# Patient Record
Sex: Female | Born: 1980
Health system: Southern US, Community
[De-identification: ages and names within clinical notes are randomized; demographics above are authoritative.]

## PROBLEM LIST (undated history)

## (undated) DIAGNOSIS — J309 Allergic rhinitis, unspecified: Secondary | ICD-10-CM

## (undated) DIAGNOSIS — R519 Headache, unspecified: Secondary | ICD-10-CM

## (undated) DIAGNOSIS — M792 Neuralgia and neuritis, unspecified: Secondary | ICD-10-CM

## (undated) DIAGNOSIS — R251 Tremor, unspecified: Secondary | ICD-10-CM

## (undated) DIAGNOSIS — M5126 Other intervertebral disc displacement, lumbar region: Secondary | ICD-10-CM

## (undated) DIAGNOSIS — R51 Headache: Secondary | ICD-10-CM

## (undated) DIAGNOSIS — G4089 Other seizures: Secondary | ICD-10-CM

## (undated) DIAGNOSIS — I499 Cardiac arrhythmia, unspecified: Secondary | ICD-10-CM

## (undated) DIAGNOSIS — G8929 Other chronic pain: Secondary | ICD-10-CM

## (undated) HISTORY — DX: Cardiac arrhythmia, unspecified: I49.9

## (undated) HISTORY — DX: Tremor, unspecified: R25.1

## (undated) HISTORY — DX: Other seizures: G40.89

## (undated) HISTORY — DX: Other chronic pain: G89.29

## (undated) HISTORY — DX: Headache: R51

## (undated) HISTORY — DX: Neuralgia and neuritis, unspecified: M79.2

## (undated) HISTORY — DX: Allergic rhinitis, unspecified: J30.9

## (undated) HISTORY — DX: Other intervertebral disc displacement, lumbar region: M51.26

## (undated) HISTORY — DX: Headache, unspecified: R51.9

---

## 2009-07-05 ENCOUNTER — Ambulatory Visit (HOSPITAL_COMMUNITY): Admission: RE | Admit: 2009-07-05 | Discharge: 2009-07-05 | Payer: Self-pay | Admitting: Obstetrics & Gynecology

## 2009-07-19 ENCOUNTER — Ambulatory Visit: Payer: Self-pay | Admitting: Obstetrics and Gynecology

## 2009-07-20 ENCOUNTER — Encounter: Payer: Self-pay | Admitting: Family Medicine

## 2009-07-20 ENCOUNTER — Inpatient Hospital Stay (HOSPITAL_COMMUNITY): Admission: RE | Admit: 2009-07-20 | Discharge: 2009-07-23 | Payer: Self-pay | Admitting: Obstetrics and Gynecology

## 2009-07-20 ENCOUNTER — Ambulatory Visit: Payer: Self-pay | Admitting: Family Medicine

## 2010-07-10 LAB — CBC
MCHC: 34.3 g/dL (ref 30.0–36.0)
MCHC: 34.7 g/dL (ref 30.0–36.0)
Platelets: 174 10*3/uL (ref 150–400)
RBC: 3.3 MIL/uL — ABNORMAL LOW (ref 3.87–5.11)
RDW: 12.6 % (ref 11.5–15.5)
WBC: 10.1 10*3/uL (ref 4.0–10.5)

## 2012-09-03 ENCOUNTER — Other Ambulatory Visit (HOSPITAL_COMMUNITY)
Admission: RE | Admit: 2012-09-03 | Discharge: 2012-09-03 | Disposition: A | Discharge status: Home / Self Care | Payer: BC Managed Care – PPO | Source: Ambulatory Visit

## 2012-09-03 DIAGNOSIS — Z124 Encounter for screening for malignant neoplasm of cervix: Secondary | ICD-10-CM | POA: Insufficient documentation

## 2013-04-21 HISTORY — PX: KNEE SURGERY: SHX244

## 2013-10-20 ENCOUNTER — Other Ambulatory Visit: Payer: Self-pay | Admitting: *Deleted

## 2013-10-20 ENCOUNTER — Encounter: Payer: Self-pay | Admitting: *Deleted

## 2013-10-20 ENCOUNTER — Encounter (INDEPENDENT_AMBULATORY_CARE_PROVIDER_SITE_OTHER): Payer: BC Managed Care – PPO

## 2013-10-20 DIAGNOSIS — R002 Palpitations: Secondary | ICD-10-CM

## 2013-10-20 DIAGNOSIS — R0789 Other chest pain: Secondary | ICD-10-CM

## 2013-10-20 NOTE — Progress Notes (Signed)
Patient ID: Cathy Morrow, female   DOB: 13-Apr-1981, 33 y.o.   MRN: [id number] E-Cardio 48 hour holter monitor applied to patient.

## 2014-03-29 ENCOUNTER — Encounter (HOSPITAL_COMMUNITY): Payer: Self-pay | Admitting: Emergency Medicine

## 2014-03-29 ENCOUNTER — Emergency Department (HOSPITAL_COMMUNITY)
Admission: EM | Admit: 2014-03-29 | Discharge: 2014-03-29 | Disposition: A | Discharge status: Home / Self Care | Payer: BC Managed Care – PPO | Attending: Emergency Medicine | Admitting: Emergency Medicine

## 2014-03-29 DIAGNOSIS — Z880 Allergy status to penicillin: Secondary | ICD-10-CM | POA: Insufficient documentation

## 2014-03-29 DIAGNOSIS — Y998 Other external cause status: Secondary | ICD-10-CM | POA: Insufficient documentation

## 2014-03-29 DIAGNOSIS — S24109A Unspecified injury at unspecified level of thoracic spinal cord, initial encounter: Secondary | ICD-10-CM | POA: Insufficient documentation

## 2014-03-29 DIAGNOSIS — Y92410 Unspecified street and highway as the place of occurrence of the external cause: Secondary | ICD-10-CM | POA: Diagnosis not present

## 2014-03-29 DIAGNOSIS — S199XXA Unspecified injury of neck, initial encounter: Secondary | ICD-10-CM | POA: Diagnosis not present

## 2014-03-29 DIAGNOSIS — Y9389 Activity, other specified: Secondary | ICD-10-CM | POA: Diagnosis not present

## 2014-03-29 DIAGNOSIS — S3992XA Unspecified injury of lower back, initial encounter: Secondary | ICD-10-CM | POA: Insufficient documentation

## 2014-03-29 DIAGNOSIS — M545 Low back pain: Secondary | ICD-10-CM

## 2014-03-29 MED ORDER — IBUPROFEN 800 MG PO TABS: 800.0000 mg | ORAL_TABLET | Freq: Three times a day (TID) | ORAL | Status: DC

## 2014-03-29 MED ORDER — METHOCARBAMOL 500 MG PO TABS: 500.0000 mg | ORAL_TABLET | Freq: Two times a day (BID) | ORAL | Status: DC

## 2014-03-29 NOTE — ED Notes (Signed)
Pt in need of ride home. Pt does not know of anyone to come get her from ED.
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
## 2014-03-29 NOTE — Discharge Instructions (Signed)

## 2014-04-05 ENCOUNTER — Other Ambulatory Visit: Payer: Self-pay | Admitting: Family Medicine

## 2014-04-05 ENCOUNTER — Ambulatory Visit
Admission: RE | Admit: 2014-04-05 | Discharge: 2014-04-05 | Disposition: A | Discharge status: Home / Self Care | Payer: BC Managed Care – PPO | Source: Ambulatory Visit | Attending: Family Medicine | Admitting: Family Medicine

## 2014-04-05 DIAGNOSIS — M25552 Pain in left hip: Secondary | ICD-10-CM

## 2014-04-05 DIAGNOSIS — M25532 Pain in left wrist: Secondary | ICD-10-CM

## 2014-04-05 DIAGNOSIS — M5489 Other dorsalgia: Secondary | ICD-10-CM

## 2014-04-26 ENCOUNTER — Ambulatory Visit: Payer: BLUE CROSS/BLUE SHIELD | Attending: Family Medicine | Admitting: Physical Therapy

## 2014-04-26 DIAGNOSIS — S3992XD Unspecified injury of lower back, subsequent encounter: Secondary | ICD-10-CM | POA: Diagnosis present

## 2014-04-26 DIAGNOSIS — M542 Cervicalgia: Secondary | ICD-10-CM | POA: Diagnosis not present

## 2014-04-26 DIAGNOSIS — M79602 Pain in left arm: Secondary | ICD-10-CM | POA: Diagnosis not present

## 2014-04-26 DIAGNOSIS — M545 Low back pain: Secondary | ICD-10-CM | POA: Insufficient documentation

## 2014-04-26 DIAGNOSIS — M25532 Pain in left wrist: Secondary | ICD-10-CM | POA: Insufficient documentation

## 2014-05-02 ENCOUNTER — Ambulatory Visit: Payer: BLUE CROSS/BLUE SHIELD | Admitting: Physical Therapy

## 2014-05-02 DIAGNOSIS — S3992XD Unspecified injury of lower back, subsequent encounter: Secondary | ICD-10-CM | POA: Diagnosis not present

## 2014-05-04 ENCOUNTER — Ambulatory Visit: Payer: BLUE CROSS/BLUE SHIELD | Admitting: Physical Therapy

## 2014-05-04 DIAGNOSIS — S3992XD Unspecified injury of lower back, subsequent encounter: Secondary | ICD-10-CM | POA: Diagnosis not present

## 2014-05-08 ENCOUNTER — Ambulatory Visit: Payer: BLUE CROSS/BLUE SHIELD | Admitting: Physical Therapy

## 2014-05-08 DIAGNOSIS — S3992XD Unspecified injury of lower back, subsequent encounter: Secondary | ICD-10-CM | POA: Diagnosis not present

## 2014-05-08 DIAGNOSIS — M545 Low back pain, unspecified: Secondary | ICD-10-CM

## 2014-05-08 DIAGNOSIS — M549 Dorsalgia, unspecified: Secondary | ICD-10-CM

## 2014-05-08 DIAGNOSIS — M25532 Pain in left wrist: Secondary | ICD-10-CM

## 2014-05-08 NOTE — Therapy (Signed)
[facility] [address] [place], [place], [address] Phone: [phone]   Fax:  [phone]  Physical Therapy Treatment  Patient Details  Name: Cathy Morrow MRN: [id number] Date of Birth: 07-01-1980 Referring Provider:  Farris Has, MD  Encounter Date: 05/08/2014      PT End of Session - 05/08/14 1705    PT Start Time 1549   PT Stop Time 1645   PT Time Calculation (min) 56 min   Activity Tolerance Patient tolerated treatment well;Patient limited by pain      No past medical history on file.  No past surgical history on file.  There were no vitals taken for this visit.  Visit Diagnosis:  Wrist pain, left  Pain of mid back  Left-sided low back pain without sciatica      Subjective Assessment - 05/08/14 1553    Symptoms 6/10 to 10/10 wrist.  Has not seen chiropractor since she started PT.  Back pain 9/10 hip 9/10   Pain Score 9    Pain Location Back   Pain Orientation Left;Lower;Upper   Pain Descriptors / Indicators Spasm;Constant  pain   Aggravating Factors  standing, moving    Pain Relieving Factors Pillows, manual   Effect of Pain on Daily Activities With working pain increases.   Multiple Pain Sites Yes   Pain Score 6   Pain Location Wrist   Pain Descriptors / Indicators Tiring                    [facility] Adult PT Treatment/Exercise - 05/08/14 1600    Exercises   Exercises --  active range to Lt wrist fingers.   Moist Heat Therapy   Number Minutes Moist Heat 30 Minutes   Moist Heat Location --  back   Electrical Stimulation   Electrical Stimulation Location Mid and lower back   Electrical Stimulation Action IFC   Electrical Stimulation Goals Pain   Manual Therapy   Manual Therapy Edema management;Passive ROM  taping for creating space at wrist and edema control.wrist f   Passive ROM wrist, forearm                PT Education - 05/08/14 1654    Education provided Yes   Education Details retrograde, Lt arm starting with axilla and working down arm to hand.   Methods Explanation;Demonstration   Comprehension Verbalized understanding          PT Short Term Goals - 05/08/14 1709    PT SHORT TERM GOAL #5   Time 4   Period Weeks   Status On-going           PT Long Term Goals - 05/08/14 1709    PT LONG TERM GOAL #1   Title Demonstrate and or verbalize techniques to reduce the risk of re-injury to include info on:  posture, body mechnics.   Time 8   Period Weeks   Status Achieved   PT LONG TERM GOAL #2   Title Be independent with advanced HEP for lumbar strengthening.   Time 8   Period Weeks   Status On-going   PT LONG TERM GOAL #3   Title Dress herself with pain decreased >/= 75%   Time 8   Period Weeks   Status On-going   PT LONG TERM GOAL #4   Title Sleep with pain decreased >/= 75%   Time 8   Period Weeks   Status On-going   PT LONG TERM GOAL #  5   Title Walk at work with pain decreased >/= 75%.   Time 8   Period Weeks   Status On-going   Additional Long Term Goals   Additional Long Term Goals Yes   PT LONG TERM GOAL #6   Title Grip items with Lt hand with >/= 75% greater ease.   Time 8   Period Weeks   Status On-going   PT LONG TERM GOAL #7   Title Peform daily activities with her 32 year old son with >/= 75% greater ease.   Time 8   Period Weeks   Status On-going               Plan - 05/08/14 1656    Clinical Impression Statement manual helpful for Lt wrist, modalities to back during wrist treatment.  Patient has decided to not see chiropractor while seeing PT for her back.  Forearm 1/2 cm larger Lt>Rt.  2 " from crease.   PT Next Visit Plan assess tape begin wrist exercises, wean from wrist brace.  Back manual and stretches, progress home exercises.   Consulted and Agree with Plan of Care Patient        Problem List There are no active problems to display for this patient. Cathy BeachKaren Odalis Jordan, PTA 05/08/2014 5:29  PM Phone: [phone] Fax: [phone]   Cathy Morrow,Cathy Morrow 05/08/2014, 5:29 PM  [facility] [address] [place], [place], [address] Phone: [phone]   Fax:  [phone]

## 2014-05-10 ENCOUNTER — Telehealth: Payer: Self-pay | Admitting: *Deleted

## 2014-05-10 ENCOUNTER — Ambulatory Visit: Payer: BLUE CROSS/BLUE SHIELD | Admitting: Physical Therapy

## 2014-05-10 DIAGNOSIS — M25532 Pain in left wrist: Secondary | ICD-10-CM

## 2014-05-10 DIAGNOSIS — M549 Dorsalgia, unspecified: Secondary | ICD-10-CM

## 2014-05-10 DIAGNOSIS — S3992XD Unspecified injury of lower back, subsequent encounter: Secondary | ICD-10-CM | POA: Diagnosis not present

## 2014-05-10 DIAGNOSIS — M545 Low back pain, unspecified: Secondary | ICD-10-CM

## 2014-05-10 NOTE — Patient Instructions (Addendum)
Ask front desk for form to sign for record release to lawyer. Remove tape if it irritates.

## 2014-05-10 NOTE — Therapy (Signed)
[facility] [address] [place], [place], [address] Phone: [phone]   Fax:  [phone]  Physical Therapy Treatment  Patient Details  Name: Cathy Morrow MRN: [id number] Date of Birth: 01/21/1981 Referring Provider:  Farris HasMorrow, Aaron, MD  Encounter Date: 05/10/2014      PT End of Session - 05/10/14 1648    Visit Number 5   Number of Visits 24   PT Start Time 1550   PT Stop Time 1634   PT Time Calculation (min) 44 min   Activity Tolerance Patient limited by pain      No past medical history on file.  No past surgical history on file.  There were no vitals taken for this visit.  Visit Diagnosis:  Wrist pain, left  Pain of mid back  Left-sided low back pain without sciatica      Subjective Assessment - 05/10/14 1608    Symptoms tape helped4/ 10 vs 8/10.  now4/10 able to open close hand with no pain.  Doing edema retrograde several times a day.  Back 6/10  better with extension, back better with extension stretches.   Pain Score 6    Pain Location Back   Pain Orientation Left;Lower;Upper   Pain Descriptors / Indicators Spasm   Aggravating Factors  standing moving   Pain Relieving Factors pillow, manual   Effect of Pain on Daily Activities pain with activity, frequent rests   Pain Score 4   Pain Location Wrist   Pain Orientation Left   Pain Radiating Towards hand.   Pain Descriptors / Indicators Tiring  feels different, strange like I've given blood   Pain Frequency Intermittent                    [facility] Adult PT Treatment/Exercise - 05/10/14 1550    Exercises   Exercises --  hand and wrist exercises added to home from drawer.   Lumbar Exercises: Stretches   Passive Hamstring Stretch 3 reps;30 seconds  increased back spasm, using sheet.   Single Knee to Chest Stretch 5 reps;10 seconds   Pelvic Tilt --  10 reps 5 seconds after instruction able to do correctly.   Prone on Elbows Stretch 60  seconds  felt good but reported feeling dizzy with stretches.   Manual Therapy   Manual Therapy --  taping Lt arm replaced.  Added tape along paraspinals     PT Lucy AntiguaSteve Chasse informed of patient's dizziness.           PT Education - 05/10/14 1647    Education provided Yes   Education Details hand, wrist exercise.   Person(s) Educated Patient   Methods Explanation;Demonstration;Handout   Comprehension Verbalized understanding          PT Short Term Goals - 05/08/14 1709    PT SHORT TERM GOAL #5   Time 4   Period Weeks   Status On-going           PT Long Term Goals - 05/08/14 1709    PT LONG TERM GOAL #1   Title Demonstrate and or verbalize techniques to reduce the risk of re-injury to include info on:  posture, body mechnics.   Time 8   Period Weeks   Status Achieved   PT LONG TERM GOAL #2   Title Be independent with advanced HEP for lumbar strengthening.   Time 8   Period Weeks   Status On-going   PT LONG TERM GOAL #3   Title  Dress herself with pain decreased >/= 75%   Time 8   Period Weeks   Status On-going   PT LONG TERM GOAL #4   Title Sleep with pain decreased >/= 75%   Time 8   Period Weeks   Status On-going   PT LONG TERM GOAL #5   Title Walk at work with pain decreased >/= 75%.   Time 8   Period Weeks   Status On-going   Additional Long Term Goals   Additional Long Term Goals Yes   PT LONG TERM GOAL #6   Title Grip items with Lt hand with >/= 75% greater ease.   Time 8   Period Weeks   Status On-going   PT LONG TERM GOAL #7   Title Peform daily activities with her 80 year old son with >/= 75% greater ease.   Time 8   Period Weeks   Status On-going               Plan - 05/10/14 1649    Clinical Impression Statement tape helpful. unable to add back exercises to home due to pain or dizziness.  able to add wrist exercises for home.   pain wrist hand fist now pain free, intermittant.   PT Next Visit Plan assesstape, review  wrist/hand exercises.  continue back exercises as tolerated.        Problem List There are no active problems to display for this patient. Liz Beach, PTA1/20/2016, 5:05 PM  [facility] [address] [place], [place], [address] Phone: [phone]   Fax:  [phone]

## 2014-05-10 NOTE — Telephone Encounter (Signed)
Pt requested for more appts. Gave her four more...td

## 2014-05-11 ENCOUNTER — Telehealth: Payer: Self-pay | Admitting: *Deleted
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
## 2014-05-12 ENCOUNTER — Ambulatory Visit: Payer: BLUE CROSS/BLUE SHIELD | Admitting: Physical Therapy

## 2014-05-17 ENCOUNTER — Ambulatory Visit: Payer: BLUE CROSS/BLUE SHIELD | Admitting: Physical Therapy

## 2014-05-17 DIAGNOSIS — M545 Low back pain, unspecified: Secondary | ICD-10-CM

## 2014-05-17 DIAGNOSIS — M549 Dorsalgia, unspecified: Secondary | ICD-10-CM

## 2014-05-17 DIAGNOSIS — M25532 Pain in left wrist: Secondary | ICD-10-CM

## 2014-05-17 DIAGNOSIS — S3992XD Unspecified injury of lower back, subsequent encounter: Secondary | ICD-10-CM | POA: Diagnosis not present

## 2014-05-17 NOTE — Patient Instructions (Signed)
Elbow Prop (Extension)   Prop body up on elbows for __30__ seconds. Slowly lower it. Repeat 3____ times. Do _1-2___ sessions per day.  http://gt2.exer.us/243   Copyright  VHI. All rights reserved.  Back Hyperextension: Using Arms       .  Copyright  VHI. All rights reserved.   Copyright  VHI. All rights reserved.   Copyright  VHI. All rights reserved.  Backward Bend (Standing)   Arch backward to make hollow of back deeper. Hold _10- 20___ seconds. Repeat ___3-5_ times per set. Do _1-2___ sets per session. Do __as needed__ sessions per day.  http://orth.exer.us/178   Copyright  VHI. All rights reserved.   Bracing With Arm Lift (Prone)   With pillow support, lie on abdomen. Find neutral spine. Tighten pelvic floor and abdominals and hold . Alternately raise arms off floor. Repeat 5-10___ times. Do _1-2__ times a day.   Copyright  VHI. All rights reserved.  Bracing With Leg Lift (Prone)   With pillow support, lie on abdomen and neutral spine, tighten pelvic floor and abdominals and hold. Alternately raise legs off floor. Repeat _5-10__ times. Do 1-2___ times a day.   Copyright  VHI. All rights reserved.   Bracing With Arm / Leg Lift (Prone)   With pillow support, lie on abdomen. Find neutral spine. Tighten pelvic floor and abdominals and hold. Alternately raise one arm and opposite leg. Repeat 5 - 10___ times. Do ___ times a day.   Copyright  VHI. All rights reserved.

## 2014-05-17 NOTE — Therapy (Signed)
[facility] [address] [place], [place], [address] Phone: [phone]   Fax:  [phone]  Physical Therapy Treatment  Patient Details  Name: Cathy Morrow MRN: [id number] Date of Birth: 10/19/1980 Referring Provider:  Morrow, Aaron, MD  Encounter Date: 05/17/2014      PT End of Session - 05/17/14 1223    Visit Number 6   Number of Visits 24   PT Start Time 0803   PT Stop Time 0905   PT Time Calculation (min) 62 min   Activity Tolerance Patient tolerated treatment well;Patient limited by pain      No past medical history on file.  No past surgical history on file.  There were no vitals taken for this visit.  Visit Diagnosis:  Wrist pain, left  Pain of mid back  Left-sided low back pain without sciatica      Subjective Assessment - 05/17/14 0805    Symptoms back 5/10 less spasm with tape, wrist 8/10 can't find a way to get comfortable to sleep. Cannot make a fist today .  hand goes numb at night.  Feels like it is not circulating. Can't wear rings due to fingers swelling intermittantly.   Pain Score 5    Pain Location Back   Pain Orientation Left  whole back rt and lt.   Pain Descriptors / Indicators Spasm;Aching;Dull   Aggravating Factors  standing on feet, sitting unsupported,   Pain Relieving Factors nothing really, warm showers, laying down   Effect of Pain on Daily Activities stretches, sit down    Multiple Pain Sites Yes   Pain Score 8   Pain Location Wrist   Pain Orientation Left   Pain Radiating Towards fingers   Pain Descriptors / Indicators Numbness  swollen   Pain Frequency Constant                    [facility] Adult PT Treatment/Exercise - 05/17/14 0810    Lumbar Exercises: Stretches   Prone on Elbows Stretch 3 reps;30 seconds   Lumbar Exercises: Prone   Single Arm Raise 5 reps   Straight Leg Raise 5 reps   Opposite Arm/Leg Raise Left arm/Right leg;Right arm/Left leg;5 reps   cues, rests brief needed.   Moist Heat Therapy   Number Minutes Moist Heat 15 Minutes   Moist Heat Location --  back   Cryotherapy   Number Minutes Cryotherapy 15 Minutes   Cryotherapy Location Wrist  Lt   Type of Cryotherapy Other (comment)  Gel pack   Manual Therapy   Manual Therapy --  taping replaced wrist and back as previous                PT Education - 05/17/14 1229    Education provided Yes   Education Details prone back strengthening, prone on elbows, standing back extension   Person(s) Educated Patient   Methods Explanation;Demonstration;Handout   Comprehension Verbalized understanding;Returned demonstration          PT Short Term Goals - 05/17/14 0811    PT SHORT TERM GOAL #1   Title Be independent with initial home exercises for flexibility exercises.   Time 4   Status Achieved   PT SHORT TERM GOAL #2   Title dress herself with pain decreased >/= 25%   Time 4   Period Weeks   Status On-going   PT SHORT TERM GOAL #3   Title Sleep with pain decreased>/= 25%   Time 4     Period Weeks   Status On-going   PT SHORT TERM GOAL #4   Title walk at work with pain decreased >/= 25%   Time 4   Period Weeks   Status On-going           PT Long Term Goals - 05/17/14 0813    PT LONG TERM GOAL #3   Title Dress herself with pain decreased >/= 75%   Time 8   Period Weeks   Status On-going   PT LONG TERM GOAL #4   Title Sleep with pain decreased >/= 75%   Time 8   Period Weeks   Status Not Met   PT LONG TERM GOAL #5   Title Walk at work with pain decreased >/= 75%.   Time 8   Status On-going               Plan - 05/17/14 1230    Clinical Impression Statement No dizziness today, Pain has returned to wrist. Able to progress back exercise program.  Fewer back spasms noted with tape.  Patient very focused on her wrist pain.  Pain with walking 10% improved. Dressing and sleeping unchanged.   PT Next Visit Plan review home exercise back  stabilization 1.(from drawer) Review hand exercises (From drawer) Any PT techniques?        Problem List There are no active problems to display for this patient.  Karen Harris, PTA 05/17/2014 12:42 PM Phone: [phone] Fax: [phone]  HARRIS,KAREN 05/17/2014, 12:42 PM  [facility] [address] [place], [place], [address] Phone: [phone]   Fax:  [phone]      

## 2014-05-22 ENCOUNTER — Ambulatory Visit: Payer: BLUE CROSS/BLUE SHIELD | Attending: Family Medicine | Admitting: Physical Therapy

## 2014-05-22 ENCOUNTER — Encounter: Payer: Self-pay | Admitting: Physical Therapy

## 2014-05-22 DIAGNOSIS — M542 Cervicalgia: Secondary | ICD-10-CM | POA: Insufficient documentation

## 2014-05-22 DIAGNOSIS — M25532 Pain in left wrist: Secondary | ICD-10-CM | POA: Insufficient documentation

## 2014-05-22 DIAGNOSIS — M545 Low back pain: Secondary | ICD-10-CM | POA: Insufficient documentation

## 2014-05-22 DIAGNOSIS — S3992XD Unspecified injury of lower back, subsequent encounter: Secondary | ICD-10-CM | POA: Insufficient documentation

## 2014-05-22 DIAGNOSIS — M79602 Pain in left arm: Secondary | ICD-10-CM | POA: Insufficient documentation

## 2014-05-24 ENCOUNTER — Ambulatory Visit: Payer: BLUE CROSS/BLUE SHIELD | Admitting: Physical Therapy

## 2014-05-24 ENCOUNTER — Encounter: Payer: BLUE CROSS/BLUE SHIELD | Admitting: Physical Therapy
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
## 2015-02-22 ENCOUNTER — Encounter: Payer: Self-pay | Admitting: Neurology

## 2015-02-22 ENCOUNTER — Ambulatory Visit (INDEPENDENT_AMBULATORY_CARE_PROVIDER_SITE_OTHER): Payer: BLUE CROSS/BLUE SHIELD | Admitting: Neurology

## 2015-02-22 VITALS — BP 138/99 | HR 87 | Ht 66.0 in | Wt 212.8 lb

## 2015-02-22 DIAGNOSIS — R413 Other amnesia: Secondary | ICD-10-CM

## 2015-02-22 DIAGNOSIS — R51 Headache: Secondary | ICD-10-CM

## 2015-02-22 DIAGNOSIS — S0990XA Unspecified injury of head, initial encounter: Secondary | ICD-10-CM | POA: Diagnosis not present

## 2015-02-22 DIAGNOSIS — R4020 Unspecified coma: Secondary | ICD-10-CM

## 2015-02-22 DIAGNOSIS — R404 Transient alteration of awareness: Secondary | ICD-10-CM | POA: Diagnosis not present

## 2015-02-22 DIAGNOSIS — F0781 Postconcussional syndrome: Secondary | ICD-10-CM | POA: Diagnosis not present

## 2015-02-22 DIAGNOSIS — H538 Other visual disturbances: Secondary | ICD-10-CM

## 2015-02-22 DIAGNOSIS — R519 Headache, unspecified: Secondary | ICD-10-CM

## 2015-02-22 MED ORDER — NORTRIPTYLINE HCL 10 MG PO CAPS: 10.0000 mg | ORAL_CAPSULE | Freq: Every day | ORAL | Status: DC
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
## 2015-02-22 NOTE — Patient Instructions (Addendum)
Overall you are doing fairly well but I do want to suggest a few things today:   Remember to drink plenty of fluid, eat healthy meals and do not skip any meals. Try to eat protein with a every meal and eat a healthy snack such as fruit or nuts in between meals. Try to keep a regular sleep-wake schedule and try to exercise daily, particularly in the form of walking, 20-30 minutes a day, if you can.   As far as your medications are concerned, I would like to suggest: Nortriptyline 10mg  an hour before bedtime.  As far as diagnostic testing: Neuropsychological testing, MRi of the brain, Labs  Please call us with any interim questions, concerns, problems, updates or refill requests.   Please also call us for any test results so we can go over those with you on the phone.  My clinical assistant and will answer any of your questions and relay your messages to me and also relay most of my messages to you.   Our phone number is [phone]. We also have an after hours call service for urgent matters and there is a physician on-call for urgent questions. For any emergencies you know to call 911 or go to the nearest emergency room

## 2015-02-25 ENCOUNTER — Encounter: Payer: Self-pay | Admitting: Neurology

## 2015-02-25 DIAGNOSIS — F0781 Postconcussional syndrome: Secondary | ICD-10-CM | POA: Insufficient documentation

## 2015-02-25 LAB — METHYLMALONIC ACID, SERUM: Methylmalonic Acid: 195 nmol/L (ref 0–378)

## 2015-02-25 LAB — B12 AND FOLATE PANEL
FOLATE: 13.3 ng/mL (ref >3.0)
VITAMIN B 12: 355 pg/mL (ref 211–946)

## 2015-02-26 ENCOUNTER — Telehealth: Payer: Self-pay | Admitting: *Deleted

## 2015-02-26 NOTE — Telephone Encounter (Signed)
-----   Message from Anson FretAntonia B Ahern, MD sent at 02/25/2015  7:07 PM EST ----- Let patient know labs are normal thanks

## 2015-02-26 NOTE — Telephone Encounter (Signed)
LVM about normal labs. Okay per DPR. Gave [facility] phone number and hours if she has further questions.

## 2015-03-19 ENCOUNTER — Encounter: Payer: Self-pay | Admitting: Neurology

## 2015-03-21 ENCOUNTER — Encounter (INDEPENDENT_AMBULATORY_CARE_PROVIDER_SITE_OTHER): Payer: Self-pay

## 2015-03-21 ENCOUNTER — Ambulatory Visit (INDEPENDENT_AMBULATORY_CARE_PROVIDER_SITE_OTHER): Payer: BLUE CROSS/BLUE SHIELD

## 2015-03-21 DIAGNOSIS — R413 Other amnesia: Secondary | ICD-10-CM | POA: Diagnosis not present

## 2015-03-21 DIAGNOSIS — H538 Other visual disturbances: Secondary | ICD-10-CM

## 2015-03-21 DIAGNOSIS — R51 Headache: Secondary | ICD-10-CM

## 2015-03-21 DIAGNOSIS — S0990XA Unspecified injury of head, initial encounter: Secondary | ICD-10-CM

## 2015-03-21 DIAGNOSIS — R519 Headache, unspecified: Secondary | ICD-10-CM

## 2015-03-21 DIAGNOSIS — R4020 Unspecified coma: Secondary | ICD-10-CM

## 2015-03-21 DIAGNOSIS — R404 Transient alteration of awareness: Secondary | ICD-10-CM

## 2015-03-22 MED ORDER — GADOPENTETATE DIMEGLUMINE 469.01 MG/ML IV SOLN: 20.0000 mL | Freq: Once | INTRAVENOUS | Status: AC | PRN

## 2015-03-23 ENCOUNTER — Telehealth: Payer: Self-pay | Admitting: *Deleted

## 2015-03-23 NOTE — Telephone Encounter (Signed)
-----   Message from Anson FretAntonia B Ahern, MD sent at 03/23/2015  7:51 AM EST ----- MRi of the brain is normal thanks

## 2015-03-23 NOTE — Telephone Encounter (Signed)
LVM about normal MRI brain per Dr Lucia GaskinsAhern. Gave [facility] phone number and hours if she has further questions.

## 2015-05-28 ENCOUNTER — Ambulatory Visit (INDEPENDENT_AMBULATORY_CARE_PROVIDER_SITE_OTHER): Payer: BLUE CROSS/BLUE SHIELD | Admitting: Neurology

## 2015-05-28 ENCOUNTER — Encounter: Payer: Self-pay | Admitting: Neurology

## 2015-05-28 VITALS — BP 150/95 | HR 74 | Ht 66.0 in | Wt 212.0 lb

## 2015-05-28 DIAGNOSIS — F329 Major depressive disorder, single episode, unspecified: Secondary | ICD-10-CM

## 2015-05-28 DIAGNOSIS — F32A Depression, unspecified: Secondary | ICD-10-CM

## 2015-05-28 MED ORDER — CITALOPRAM HYDROBROMIDE 10 MG PO TABS: 10.0000 mg | ORAL_TABLET | Freq: Every day | ORAL | Status: DC

## 2015-05-28 NOTE — Patient Instructions (Addendum)
Remember to drink plenty of fluid, eat healthy meals and do not skip any meals. Try to eat protein with a every meal and eat a healthy snack such as fruit or nuts in between meals. Try to keep a regular sleep-wake schedule and try to exercise daily, particularly in the form of walking, 20-30 minutes a day, if you can.   As far as your medications are concerned, I would like to suggest: Celexa  in the morning. Stop nortriptyline   As far as diagnostic testing: Memory testing  I would like to see you back after memory testing, sooner if we need to. Please call us with any interim questions, concerns, problems, updates or refill requests.   Our phone number is [phone]. We also have an after hours call service for urgent matters and there is a physician on-call for urgent questions. For any emergencies you know to call 911 or go to the nearest emergency room
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
## 2015-07-02 ENCOUNTER — Telehealth: Payer: Self-pay | Admitting: Neurology

## 2015-07-02 DIAGNOSIS — R413 Other amnesia: Secondary | ICD-10-CM

## 2015-07-02 NOTE — Telephone Encounter (Signed)
Patient called regarding referral to Psychiatrist, has never heard back from anyone also referral for memory testing, was referred in November, didn't hear from anyone.

## 2015-07-02 NOTE — Telephone Encounter (Signed)
Looking back and Dr. Trevor MaceAhern's note patient needed to be seen  by Dr. Leonides CaveZelson. I have spoke to patient and she will waiting  on Dr. Leonides CaveZelson to call her. Cathy Morrow  Please place a need order for Dr. Leonides CaveZelson Neuro rehab . Thanks Annabelle Harmanana .

## 2015-07-03 NOTE — Telephone Encounter (Signed)
Order has been sent to Dr. Leonides CaveZelson.

## 2015-07-24 DIAGNOSIS — M5416 Radiculopathy, lumbar region: Secondary | ICD-10-CM | POA: Diagnosis not present

## 2015-07-25 DIAGNOSIS — M5442 Lumbago with sciatica, left side: Secondary | ICD-10-CM | POA: Diagnosis not present

## 2015-07-25 DIAGNOSIS — G8929 Other chronic pain: Secondary | ICD-10-CM | POA: Diagnosis not present

## 2015-07-27 DIAGNOSIS — M5442 Lumbago with sciatica, left side: Secondary | ICD-10-CM | POA: Diagnosis not present

## 2015-07-27 DIAGNOSIS — G8929 Other chronic pain: Secondary | ICD-10-CM | POA: Diagnosis not present

## 2015-08-02 DIAGNOSIS — M5442 Lumbago with sciatica, left side: Secondary | ICD-10-CM | POA: Diagnosis not present

## 2015-08-02 DIAGNOSIS — G8929 Other chronic pain: Secondary | ICD-10-CM | POA: Diagnosis not present

## 2015-08-08 DIAGNOSIS — G8929 Other chronic pain: Secondary | ICD-10-CM | POA: Diagnosis not present

## 2015-08-08 DIAGNOSIS — M5442 Lumbago with sciatica, left side: Secondary | ICD-10-CM | POA: Diagnosis not present

## 2015-08-10 DIAGNOSIS — M5442 Lumbago with sciatica, left side: Secondary | ICD-10-CM | POA: Diagnosis not present

## 2015-08-10 DIAGNOSIS — G8929 Other chronic pain: Secondary | ICD-10-CM | POA: Diagnosis not present

## 2015-08-14 ENCOUNTER — Telehealth: Payer: Self-pay | Admitting: Neurology

## 2015-08-14 NOTE — Telephone Encounter (Signed)
Thank you Annabelle Harmanana. Noted.

## 2015-08-14 NOTE — Telephone Encounter (Signed)
Fax was sent relaying - Patient failed to make initial contact with office . Patient has not called back to schedule voice mails have been left for patient. If patient want's to schedule she can call office at [phone].  Emma fax placed on your desk. Thanks Annabelle Harmanana.

## 2015-08-15 DIAGNOSIS — M5442 Lumbago with sciatica, left side: Secondary | ICD-10-CM | POA: Diagnosis not present

## 2015-08-15 DIAGNOSIS — G8929 Other chronic pain: Secondary | ICD-10-CM | POA: Diagnosis not present

## 2015-08-17 DIAGNOSIS — G8929 Other chronic pain: Secondary | ICD-10-CM | POA: Diagnosis not present

## 2015-08-17 DIAGNOSIS — M5442 Lumbago with sciatica, left side: Secondary | ICD-10-CM | POA: Diagnosis not present

## 2015-08-21 ENCOUNTER — Ambulatory Visit: Payer: No Typology Code available for payment source | Admitting: Psychology

## 2015-08-21 DIAGNOSIS — M5442 Lumbago with sciatica, left side: Secondary | ICD-10-CM | POA: Diagnosis not present

## 2015-08-21 DIAGNOSIS — G8929 Other chronic pain: Secondary | ICD-10-CM | POA: Diagnosis not present

## 2015-08-23 DIAGNOSIS — M5442 Lumbago with sciatica, left side: Secondary | ICD-10-CM | POA: Diagnosis not present

## 2015-08-23 DIAGNOSIS — G8929 Other chronic pain: Secondary | ICD-10-CM | POA: Diagnosis not present

## 2015-08-30 DIAGNOSIS — G8929 Other chronic pain: Secondary | ICD-10-CM | POA: Diagnosis not present

## 2015-08-30 DIAGNOSIS — M5442 Lumbago with sciatica, left side: Secondary | ICD-10-CM | POA: Diagnosis not present

## 2015-09-04 DIAGNOSIS — G8929 Other chronic pain: Secondary | ICD-10-CM | POA: Diagnosis not present

## 2015-09-04 DIAGNOSIS — M5442 Lumbago with sciatica, left side: Secondary | ICD-10-CM | POA: Diagnosis not present

## 2015-09-06 DIAGNOSIS — M5442 Lumbago with sciatica, left side: Secondary | ICD-10-CM | POA: Diagnosis not present

## 2015-09-06 DIAGNOSIS — G8929 Other chronic pain: Secondary | ICD-10-CM | POA: Diagnosis not present

## 2015-09-11 DIAGNOSIS — G8929 Other chronic pain: Secondary | ICD-10-CM | POA: Diagnosis not present

## 2015-09-11 DIAGNOSIS — M5442 Lumbago with sciatica, left side: Secondary | ICD-10-CM | POA: Diagnosis not present

## 2015-09-13 DIAGNOSIS — G8929 Other chronic pain: Secondary | ICD-10-CM | POA: Diagnosis not present

## 2015-09-13 DIAGNOSIS — M5442 Lumbago with sciatica, left side: Secondary | ICD-10-CM | POA: Diagnosis not present

## 2015-09-18 DIAGNOSIS — M5442 Lumbago with sciatica, left side: Secondary | ICD-10-CM | POA: Diagnosis not present

## 2015-09-18 DIAGNOSIS — G8929 Other chronic pain: Secondary | ICD-10-CM | POA: Diagnosis not present

## 2015-09-21 ENCOUNTER — Ambulatory Visit: Payer: No Typology Code available for payment source | Attending: Psychology | Admitting: Psychology

## 2015-09-21 ENCOUNTER — Encounter: Payer: Self-pay | Admitting: Psychology

## 2015-09-21 DIAGNOSIS — R419 Unspecified symptoms and signs involving cognitive functions and awareness: Secondary | ICD-10-CM | POA: Insufficient documentation

## 2015-09-21 DIAGNOSIS — F4321 Adjustment disorder with depressed mood: Secondary | ICD-10-CM | POA: Insufficient documentation

## 2015-09-21 DIAGNOSIS — F329 Major depressive disorder, single episode, unspecified: Secondary | ICD-10-CM | POA: Diagnosis not present
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
## 2015-09-26 ENCOUNTER — Telehealth: Payer: Self-pay | Admitting: Psychology

## 2015-09-26 DIAGNOSIS — M5126 Other intervertebral disc displacement, lumbar region: Secondary | ICD-10-CM | POA: Diagnosis not present

## 2015-09-26 NOTE — Telephone Encounter (Signed)
Cathy Morrow cancelled her appointment on 09/28/15 set to discuss evaluation results citing financial concerns. I left her a message to call me so we can at least discuss the evaluation findings over the phone.

## 2015-09-28 ENCOUNTER — Ambulatory Visit: Payer: No Typology Code available for payment source | Admitting: Psychology

## 2015-10-09 DIAGNOSIS — G8929 Other chronic pain: Secondary | ICD-10-CM | POA: Diagnosis not present

## 2015-10-09 DIAGNOSIS — M5442 Lumbago with sciatica, left side: Secondary | ICD-10-CM | POA: Diagnosis not present

## 2015-10-11 DIAGNOSIS — G8929 Other chronic pain: Secondary | ICD-10-CM | POA: Diagnosis not present

## 2015-10-11 DIAGNOSIS — M5442 Lumbago with sciatica, left side: Secondary | ICD-10-CM | POA: Diagnosis not present

## 2015-10-16 DIAGNOSIS — M5442 Lumbago with sciatica, left side: Secondary | ICD-10-CM | POA: Diagnosis not present

## 2015-10-16 DIAGNOSIS — G8929 Other chronic pain: Secondary | ICD-10-CM | POA: Diagnosis not present

## 2015-10-18 DIAGNOSIS — M5442 Lumbago with sciatica, left side: Secondary | ICD-10-CM | POA: Diagnosis not present

## 2015-10-18 DIAGNOSIS — G8929 Other chronic pain: Secondary | ICD-10-CM | POA: Diagnosis not present

## 2015-10-25 DIAGNOSIS — G8929 Other chronic pain: Secondary | ICD-10-CM | POA: Diagnosis not present

## 2015-10-25 DIAGNOSIS — M5442 Lumbago with sciatica, left side: Secondary | ICD-10-CM | POA: Diagnosis not present

## 2015-10-30 DIAGNOSIS — G8929 Other chronic pain: Secondary | ICD-10-CM | POA: Diagnosis not present

## 2015-10-30 DIAGNOSIS — M5442 Lumbago with sciatica, left side: Secondary | ICD-10-CM | POA: Diagnosis not present

## 2015-11-01 DIAGNOSIS — M5442 Lumbago with sciatica, left side: Secondary | ICD-10-CM | POA: Diagnosis not present

## 2015-11-01 DIAGNOSIS — G8929 Other chronic pain: Secondary | ICD-10-CM | POA: Diagnosis not present

## 2016-02-11 DIAGNOSIS — M533 Sacrococcygeal disorders, not elsewhere classified: Secondary | ICD-10-CM | POA: Diagnosis not present

## 2016-02-11 DIAGNOSIS — M5126 Other intervertebral disc displacement, lumbar region: Secondary | ICD-10-CM | POA: Diagnosis not present

## 2016-02-20 DIAGNOSIS — M533 Sacrococcygeal disorders, not elsewhere classified: Secondary | ICD-10-CM | POA: Diagnosis not present

## 2016-02-20 DIAGNOSIS — M5442 Lumbago with sciatica, left side: Secondary | ICD-10-CM | POA: Diagnosis not present

## 2016-03-09 ENCOUNTER — Other Ambulatory Visit: Payer: Self-pay | Admitting: Neurology

## 2016-06-02 ENCOUNTER — Other Ambulatory Visit: Payer: Self-pay | Admitting: Neurology

## 2016-06-27 DIAGNOSIS — M47816 Spondylosis without myelopathy or radiculopathy, lumbar region: Secondary | ICD-10-CM | POA: Diagnosis not present

## 2016-06-27 DIAGNOSIS — M5126 Other intervertebral disc displacement, lumbar region: Secondary | ICD-10-CM | POA: Diagnosis not present

## 2016-07-04 ENCOUNTER — Other Ambulatory Visit: Payer: Self-pay | Admitting: Neurology

## 2016-07-14 DIAGNOSIS — J069 Acute upper respiratory infection, unspecified: Secondary | ICD-10-CM | POA: Diagnosis not present

## 2016-08-25 DIAGNOSIS — Z711 Person with feared health complaint in whom no diagnosis is made: Secondary | ICD-10-CM | POA: Diagnosis not present

## 2016-08-25 DIAGNOSIS — Z113 Encounter for screening for infections with a predominantly sexual mode of transmission: Secondary | ICD-10-CM | POA: Diagnosis not present

## 2016-08-25 DIAGNOSIS — N390 Urinary tract infection, site not specified: Secondary | ICD-10-CM | POA: Diagnosis not present

## 2017-02-03 DIAGNOSIS — B850 Pediculosis due to Pediculus humanus capitis: Secondary | ICD-10-CM | POA: Diagnosis not present

## 2017-03-04 DIAGNOSIS — G894 Chronic pain syndrome: Secondary | ICD-10-CM | POA: Diagnosis not present

## 2017-03-04 DIAGNOSIS — M47816 Spondylosis without myelopathy or radiculopathy, lumbar region: Secondary | ICD-10-CM | POA: Diagnosis not present

## 2017-03-10 DIAGNOSIS — R269 Unspecified abnormalities of gait and mobility: Secondary | ICD-10-CM | POA: Diagnosis not present

## 2017-05-25 DIAGNOSIS — M25552 Pain in left hip: Secondary | ICD-10-CM | POA: Diagnosis not present

## 2017-05-25 DIAGNOSIS — M545 Low back pain: Secondary | ICD-10-CM | POA: Diagnosis not present

## 2017-05-25 DIAGNOSIS — G8929 Other chronic pain: Secondary | ICD-10-CM | POA: Diagnosis not present

## 2017-06-01 DIAGNOSIS — M47816 Spondylosis without myelopathy or radiculopathy, lumbar region: Secondary | ICD-10-CM | POA: Diagnosis not present

## 2017-06-08 DIAGNOSIS — M461 Sacroiliitis, not elsewhere classified: Secondary | ICD-10-CM | POA: Diagnosis not present

## 2017-06-08 DIAGNOSIS — G5712 Meralgia paresthetica, left lower limb: Secondary | ICD-10-CM | POA: Diagnosis not present

## 2017-06-08 DIAGNOSIS — M47816 Spondylosis without myelopathy or radiculopathy, lumbar region: Secondary | ICD-10-CM | POA: Diagnosis not present

## 2017-06-10 DIAGNOSIS — Z0271 Encounter for disability determination: Secondary | ICD-10-CM

## 2017-06-16 DIAGNOSIS — G5712 Meralgia paresthetica, left lower limb: Secondary | ICD-10-CM | POA: Diagnosis not present

## 2017-06-22 ENCOUNTER — Ambulatory Visit (HOSPITAL_COMMUNITY)
Admission: EM | Admit: 2017-06-22 | Discharge: 2017-06-22 | Disposition: A | Discharge status: Home / Self Care | Payer: BLUE CROSS/BLUE SHIELD | Source: Home / Self Care

## 2017-06-22 ENCOUNTER — Encounter (HOSPITAL_COMMUNITY): Payer: Self-pay

## 2017-06-22 ENCOUNTER — Other Ambulatory Visit: Payer: Self-pay

## 2017-06-22 ENCOUNTER — Emergency Department (HOSPITAL_COMMUNITY): Payer: BLUE CROSS/BLUE SHIELD

## 2017-06-22 ENCOUNTER — Emergency Department (HOSPITAL_COMMUNITY)
Admission: EM | Admit: 2017-06-22 | Discharge: 2017-06-23 | Disposition: A | Discharge status: Home / Self Care | Payer: BLUE CROSS/BLUE SHIELD | Attending: Emergency Medicine | Admitting: Emergency Medicine

## 2017-06-22 DIAGNOSIS — Z79899 Other long term (current) drug therapy: Secondary | ICD-10-CM | POA: Diagnosis not present

## 2017-06-22 DIAGNOSIS — R0789 Other chest pain: Secondary | ICD-10-CM | POA: Insufficient documentation

## 2017-06-22 DIAGNOSIS — R0602 Shortness of breath: Secondary | ICD-10-CM | POA: Insufficient documentation

## 2017-06-22 DIAGNOSIS — R61 Generalized hyperhidrosis: Secondary | ICD-10-CM | POA: Insufficient documentation

## 2017-06-22 DIAGNOSIS — R079 Chest pain, unspecified: Secondary | ICD-10-CM | POA: Diagnosis not present

## 2017-06-22 LAB — CBC
HCT: 41.7 % (ref 36.0–46.0)
HEMOGLOBIN: 13.6 g/dL (ref 12.0–15.0)
MCH: 29.3 pg (ref 26.0–34.0)
MCHC: 32.6 g/dL (ref 30.0–36.0)
MCV: 89.9 fL (ref 78.0–100.0)
Platelets: 389 10*3/uL (ref 150–400)
RBC: 4.64 MIL/uL (ref 3.87–5.11)
RDW: 12.9 % (ref 11.5–15.5)
WBC: 13.2 10*3/uL — ABNORMAL HIGH (ref 4.0–10.5)

## 2017-06-22 LAB — BASIC METABOLIC PANEL
ANION GAP: 10 (ref 5–15)
BUN: 9 mg/dL (ref 6–20)
CO2: 25 mmol/L (ref 22–32)
CREATININE: 0.69 mg/dL (ref 0.44–1.00)
Calcium: 9.1 mg/dL (ref 8.9–10.3)
Chloride: 100 mmol/L — ABNORMAL LOW (ref 101–111)
GFR calc Af Amer: >60 mL/min (ref >60)
GFR calc non Af Amer: >60 mL/min (ref >60)
GLUCOSE: 83 mg/dL (ref 65–99)
Potassium: 4.1 mmol/L (ref 3.5–5.1)
Sodium: 135 mmol/L (ref 135–145)

## 2017-06-22 MED ORDER — NITROGLYCERIN 0.4 MG SL SUBL
0.4000 mg | SUBLINGUAL_TABLET | SUBLINGUAL | Status: DC | PRN
  Administered 2017-06-22: 0.4 mg via SUBLINGUAL
  Filled 2017-06-22: qty 1

## 2017-06-22 MED ORDER — ASPIRIN 81 MG PO CHEW
324.0000 mg | CHEWABLE_TABLET | Freq: Once | ORAL | Status: AC
  Administered 2017-06-22: 324 mg via ORAL
  Filled 2017-06-22: qty 4

## 2017-06-22 NOTE — ED Triage Notes (Signed)
Pt endorses left sided CP with radiation to the left neck and shoulder, with diaphoresis and shob. Pt has hx of arrhythmia. Hypertensive in triage. Axox4.

## 2017-06-22 NOTE — ED Notes (Signed)
Spoke to patient at triage. Having intermittent chest tightness.  Patient opted to go to ed

## 2017-06-22 NOTE — ED Provider Notes (Signed)
[facility] EMERGENCY DEPARTMENT Provider Note   CSN: [id number] Arrival date & time: 06/22/17  1717     History   Chief Complaint Chief Complaint  Patient presents with  . Chest Pain    HPI Cathy Morrow is a 37 y.o. female who presents the emergency department today for intermittent chest pains over the last several days. This is a new problem. Patient states that 3 days ago she started developing left sided chest pain with radiation into her neck, left shoulder and left arm that is brought on with exertion, associated with shortness of breath. She states the pain typically lasts for ~15 minutes before subsiding with rest. She states that today, around 11 while ambulating she had the onset of the same pain that has been persistent since that time. She awoke from sleeping this afternoon with continued pain and notes her skin was cold and clammy. No other episodes of diaphoresis.  The pain is not pleuritic or positional. She does not she lifted a heavy object in her shed today prior to the onset of her pain that made her feel like she pulled a muscle in her chest. She denies any associated nausea or emesis with the chest pain. She has not taken anything for her symptoms. No NTG or ASA use PTA. She states that she had an "arrhythmia" while she was pregnant several years ago. She is not sure who the cardiologist is that she saw but notes she wore a 30 day event monitor and was cleared after that time. The patient is a never smoker. No FHx of early CAD. No prior TIA/CVA/MI. No personal history of HTN, HLD or DM. No prior cardiac catheterization, echocardiogram or stress test. Patient denies cocaine or other drug use. Denies risk factors for DVT/PE including exogenous estrogen use, recent surgery (did have recent nerve block outpatient but no surgery requiring anesthesia & intubation) or travel, trauma, immobilization, smoking, previous blood clot, cough, hemoptysis, cancer, lower  extremity pain or swelling, or family history of bleeding/clotting disorder.   HPI  Past Medical History:  Diagnosis Date  . Cardiac arrhythmia   . Lumbar disc herniation    L4-5/L5-S1    Patient Active Problem List   Diagnosis Date Noted  . Depression 05/28/2015  . Post concussion syndrome 02/25/2015  . Head trauma 02/22/2015  . Memory loss 02/22/2015  . Blurry vision 02/22/2015  . Consciousness loss of (HCC) 02/22/2015  . Headache disorder 02/22/2015    Past Surgical History:  Procedure Laterality Date  . CESAREAN SECTION  2011  . KNEE SURGERY Right 2015    OB History    No data available       Home Medications    Prior to Admission medications   Medication Sig Start Date End Date Taking? Authorizing Provider  citalopram (CELEXA) 10 MG tablet TAKE 1 TABLET(10 MG) BY MOUTH DAILY 06/03/16   Anson Fret, MD  ibuprofen (ADVIL,MOTRIN) 800 MG tablet Take 1 tablet (800 mg total) by mouth 3 (three) times daily. 03/29/14   Elson Areas, PA-C  meloxicam (MOBIC) 15 MG tablet Take 15 mg by mouth as needed. 04/25/15   [provider]  methocarbamol (ROBAXIN) 500 MG tablet Take 1 tablet (500 mg total) by mouth 2 (two) times daily. Patient taking differently: Take 500 mg by mouth 2 (two) times daily as needed.  03/29/14   Elson Areas, PA-C  Multiple Vitamins-Minerals (MULTIVITAMIN ADULT PO) Take 1 tablet by mouth daily.  [provider]    Family History Family History  Problem Relation Age of Onset  . Thyroid disease Father   . Thyroid disease Paternal Grandfather   . Thyroid disease Paternal Aunt   . Dementia Neg Hx     Social History Social History   Tobacco Use  . Smoking status: Never Smoker  Substance Use Topics  . Alcohol use: No    Alcohol/week: 0.0 oz  . Drug use: No     Allergies   Penicillins   Review of Systems Review of Systems  All other systems reviewed and are negative.    Physical Exam Updated Vital Signs BP  (!) 149/97   Pulse 95   Temp 98.6 F (37 C) (Oral)   Resp 13   Ht 5\' 6"  (1.676 m)   Wt 95.3 kg (210 lb)   LMP 06/05/2017 (Exact Date)   SpO2 99%   BMI 33.89 kg/m   Physical Exam  Constitutional: She appears well-developed and well-nourished.  HENT:  Head: Normocephalic and atraumatic.  Right Ear: External ear normal.  Left Ear: External ear normal.  Nose: Nose normal.  Mouth/Throat: Uvula is midline, oropharynx is clear and moist and mucous membranes are normal. No tonsillar exudate.  Eyes: Pupils are equal, round, and reactive to light. Right eye exhibits no discharge. Left eye exhibits no discharge. No scleral icterus.  Neck: Trachea normal. Neck supple. No JVD present. No spinous process tenderness present. Carotid bruit is not present. No neck rigidity. Normal range of motion present.  Cardiovascular: Normal rate, regular rhythm and intact distal pulses.  No murmur heard. Pulses:      Radial pulses are 2+ on the right side, and 2+ on the left side.       Dorsalis pedis pulses are 2+ on the right side, and 2+ on the left side.       Posterior tibial pulses are 2+ on the right side, and 2+ on the left side.  No lower extremity swelling or edema. Calves symmetric in size bilaterally.  Pulmonary/Chest: Effort normal and breath sounds normal. She exhibits tenderness.  No increased work of breathing. No accessory muscle use. Patient is sitting upright, speaking in full sentences without difficulty     Abdominal: Soft. Bowel sounds are normal. There is no tenderness. There is no rebound and no guarding.  Musculoskeletal: She exhibits no edema.  Lymphadenopathy:    She has no cervical adenopathy.  Neurological: She is alert.  Speech clear. Follows commands. No facial droop. PERRLA. EOM grossly intact. CN III-XII grossly intact. Grossly moves all extremities 4 without ataxia. Able and appropriate strength for age to upper and lower extremities bilaterally.   Skin: Skin is warm and  dry. No rash noted. She is not diaphoretic.  No vesicular like rash on chest wall noted.   Psychiatric: She has a normal mood and affect.  Nursing note and vitals reviewed.    ED Treatments / Results  Labs (all labs ordered are listed, but only abnormal results are displayed) Labs Reviewed  BASIC METABOLIC PANEL - Abnormal; Notable for the following components:      Result Value   Chloride 100 (*)    All other components within normal limits  CBC - Abnormal; Notable for the following components:   WBC 13.2 (*)    All other components within normal limits  I-STAT TROPONIN, ED  I-STAT BETA HCG BLOOD, ED ([facility], [facility], [facility] ONLY)    EKG  EKG Interpretation  Date/Time:  Monday June 22 2017 17:25:40 EST Ventricular Rate:  107 PR Interval:  140 QRS Duration: 70 QT Interval:  312 QTC Calculation: 416 R Axis:   75 Text Interpretation:  Sinus tachycardia Otherwise normal ECG No old tracing to compare Confirmed by Melene PlanFloyd, Dan [phone]([id number]) on 06/23/2017 12:05:41 AM       Radiology Dg Chest 2 View  Result Date: 06/22/2017 CLINICAL DATA:  Chest pain EXAM: CHEST  2 VIEW COMPARISON:  None. FINDINGS: The heart size and mediastinal contours are within normal limits. Both lungs are clear. The visualized skeletal structures are unremarkable. IMPRESSION: No active cardiopulmonary disease. Electronically Signed   By: Jasmine PangKim  Fujinaga M.D.   On: 06/22/2017 18:35    Procedures Procedures (including critical care time)  Medications Ordered in ED Medications  aspirin chewable tablet 324 mg (not administered)  nitroGLYCERIN (NITROSTAT) SL tablet 0.4 mg (not administered)     Initial Impression / Assessment and Plan / ED Course  I have reviewed the triage vital signs and the nursing notes.  Pertinent labs & imaging results that were available during my care of the patient were reviewed by me and considered in my medical decision making (see chart for details).      37 y.o. female with intermittent  episodes of chest pains over the last several days. Describes the pain as  left sided chest pain with radiation into her neck, left shoulder and left arm that is brought on with exertion & associated with shortness of breath. She states the pain typically lasts for ~15 minutes before subsiding with rest. She states that today, around 11 while ambulating she had the onset of the same pain that has been persistent since that time. She awoke from sleeping this afternoon with continued pain and notes her skin was cold and clammy. No other episodes of diaphoresis.  The pain is not pleuritic or positional. She does not she lifted a heavy object in her shed today prior to the onset of her pain that made her feel like she pulled a muscle in her chest. She denies any associated nausea or emesis. She has not taken anything for her symptoms. No NTG or ASA use PTA.   Risk Factors: The patient is a never smoker. No FHx of early CAD. No prior TIA/CVA/MI. No personal history of HTN, HLD or DM. No prior cardiac catheterization, echocardiogram or stress test. Patient denies cocaine or other drug use. Denies risk factors for DVT/PE including exogenous estrogen use, recent surgery (did have recent nerve block outpatient but no surgery requiring anesthesia & intubation) or travel, trauma, immobilization, smoking, previous blood clot, cough, hemoptysis, cancer, lower extremity pain or swelling, or family history of bleeding/clotting disorder.   On presentation the patient is noted to be tachycardic. She is also noted to be hypertensive on arrival. No history of HTN. Otherwise vitals are reassuring. No fever, tachypnea, hypoxia or hypotension. On exam there is no tracheal deviation, no JVD or new murmur. Heart is slightly tachycardic with murmur or rub appreciated. Breath sounds equal bilaterally. She is tender on the chest wall as well as the left arm. Pulses equal b/l.   EKG sinus tachycardia. No ischemic changes noted. Given  patient HR >100 and patient is SOB with chest pain, will order D-Dimer. Patient CBC and CMP reassuring besides mild leukocytosis of 13.2.  Chest x-ray without cardiomegaly, acute infiltrate, pleural effusions or pulmonary edema noted.   Contacted lab who stated they never received Hcg or Tn. Will re-order.  Patient  given trial of NTG without relief or improvement of pain. Will give pain medication.   Spoke with mini-lab. Tn and Hcg not crossing over after re-draw. Tn 0.00 and Hcg <5. Vital signs improved. Patient heart score less than 3.   D-Dimer negative. Patients symptoms have resolved. Do not suspect PE. Unlikely dissection or esophageal rupture. Suspect MSK chest wall pain. Patient to be discharged home with naproxen and muscle relaxer's.  I advised the patient to follow-up with PCP this week. Specific return precautions discussed. Time was given for all questions to be answered. The patient verbalized understanding and agreement with plan. The patient appears safe for discharge home.  This patient was discussed with Dr. Adela Lank who is in agreement with assessment and plan.   Vitals:   06/22/17 2230 06/22/17 2300 06/22/17 2330 06/23/17 0000  BP: (!) 154/88 (!) 155/96 (!) 145/94 (!) 134/91  Pulse: 88 84 96 85  Resp: 20 (!) 30 18 18   Temp:      TempSrc:      SpO2: 99% 97% 96% 97%  Weight:      Height:         Final Clinical Impressions(s) / ED Diagnoses   Final diagnoses:  Atypical chest pain    ED Discharge Orders        Ordered    naproxen (NAPROSYN) 500 MG tablet  2 times daily     06/23/17 0106    methocarbamol (ROBAXIN) 500 MG tablet  2 times daily     06/23/17 0106       Princella Pellegrini 06/23/17 0106    Melene Plan, DO 06/23/17 1610

## 2017-06-23 LAB — I-STAT TROPONIN, ED
TROPONIN I, POC: 0 ng/mL (ref 0.00–0.08)
Troponin i, poc: 0 ng/mL (ref 0.00–0.08)
Troponin i, poc: 0 ng/mL (ref 0.00–0.08)

## 2017-06-23 LAB — D-DIMER, QUANTITATIVE: D-Dimer, Quant: 0.34 ug/mL-FEU (ref 0.00–0.50)

## 2017-06-23 LAB — I-STAT BETA HCG BLOOD, ED (MC, WL, AP ONLY)
I-stat hCG, quantitative: <5 m[IU]/mL (ref <5)
I-stat hCG, quantitative: <5 m[IU]/mL (ref <5)

## 2017-06-23 MED ORDER — NAPROXEN 500 MG PO TABS: 500.0000 mg | ORAL_TABLET | Freq: Two times a day (BID) | ORAL | 0 refills | Status: DC

## 2017-06-23 MED ORDER — OXYCODONE-ACETAMINOPHEN 5-325 MG PO TABS
1.0000 | ORAL_TABLET | Freq: Once | ORAL | Status: AC
  Administered 2017-06-23: 1 via ORAL
  Filled 2017-06-23: qty 1

## 2017-06-23 MED ORDER — METHOCARBAMOL 500 MG PO TABS: 500.0000 mg | ORAL_TABLET | Freq: Two times a day (BID) | ORAL | 0 refills | Status: DC

## 2017-06-23 NOTE — Discharge Instructions (Signed)
Read instructions below for reasons to return to the Emergency Department. It is recommended that your follow up with your Primary Care Doctor in regards to today's visit. If you do not have a doctor, use the resource guide listed below to help you find one.   Tests performed today include: An EKG of your heart A chest x-ray D-Dimer - test that evaluates if there is a possible blood clot. Cardiac enzymes - a blood test for heart muscle damage Blood counts and electrolytes Vital signs. See below for your results today.   Chest Pain (Nonspecific)  HOME CARE INSTRUCTIONS  For the next few days, avoid physical activities that bring on chest pain. Continue physical activities as directed.  Do not smoke cigarettes or drink alcohol until your symptoms are gone. If you do smoke, it is time to quit. You may receive instructions and counseling on how to stop smoking. Only take over-the-counter or prescription medicine for pain, discomfort, or fever as directed by your caregiver.  Follow your caregiver's suggestions for further testing if your chest pain does not go away.  Keep any follow-up appointments you made. If you do not go to an appointment, you could develop lasting (chronic) problems with pain. If there is any problem keeping an appointment, you must call to reschedule.  SEEK MEDICAL CARE IF:  You think you are having problems from the medicine you are taking. Read your medicine instructions carefully.  Your chest pain does not go away, even after treatment.  You develop a rash with blisters on your chest.  SEEK IMMEDIATE MEDICAL CARE IF:  You have increased chest pain or pain that spreads to your arm, neck, jaw, back, or belly (abdomen).  You develop shortness of breath, an increasing cough, or you are coughing up blood.  You have severe back or abdominal pain, feel sick to your stomach (nauseous) or throw up (vomit).  You develop severe weakness, fainting, or chills.  You have an oral  temperature above 102 F (38.9 C), not controlled by medicine.  THIS IS AN EMERGENCY. Do not wait to see if the pain will go away. Get medical help at once. Call your local emergency services (911 in [place] Do not drive yourself to the hospital. Additional Information:  Your vital signs today were: BP (!) 134/91    Pulse 85    Temp 98.6 F (37 C) (Oral)    Resp 18    Ht 5\' 6"  (1.676 m)    Wt 95.3 kg (210 lb)    LMP 06/05/2017 (Exact Date)    SpO2 97%    BMI 33.89 kg/m  If your blood pressure (BP) was elevated above 135/85 this visit, please have this repeated by your doctor within one month. ---------------

## 2017-06-23 NOTE — ED Notes (Signed)
ED Provider at bedside. 

## 2017-07-22 DIAGNOSIS — G894 Chronic pain syndrome: Secondary | ICD-10-CM | POA: Diagnosis not present

## 2017-07-22 DIAGNOSIS — M6281 Muscle weakness (generalized): Secondary | ICD-10-CM | POA: Diagnosis not present

## 2017-07-22 DIAGNOSIS — M5126 Other intervertebral disc displacement, lumbar region: Secondary | ICD-10-CM | POA: Diagnosis not present

## 2017-08-03 DIAGNOSIS — M5117 Intervertebral disc disorders with radiculopathy, lumbosacral region: Secondary | ICD-10-CM | POA: Diagnosis not present

## 2017-08-05 DIAGNOSIS — M5117 Intervertebral disc disorders with radiculopathy, lumbosacral region: Secondary | ICD-10-CM | POA: Diagnosis not present

## 2017-08-07 DIAGNOSIS — M5117 Intervertebral disc disorders with radiculopathy, lumbosacral region: Secondary | ICD-10-CM | POA: Diagnosis not present

## 2017-08-25 DIAGNOSIS — G5712 Meralgia paresthetica, left lower limb: Secondary | ICD-10-CM | POA: Diagnosis not present

## 2017-09-30 DIAGNOSIS — N939 Abnormal uterine and vaginal bleeding, unspecified: Secondary | ICD-10-CM | POA: Diagnosis not present

## 2017-09-30 DIAGNOSIS — R109 Unspecified abdominal pain: Secondary | ICD-10-CM | POA: Diagnosis not present

## 2017-10-21 DIAGNOSIS — D473 Essential (hemorrhagic) thrombocythemia: Secondary | ICD-10-CM | POA: Diagnosis not present

## 2017-10-23 DIAGNOSIS — M545 Low back pain: Secondary | ICD-10-CM | POA: Diagnosis not present

## 2017-11-10 DIAGNOSIS — M79606 Pain in leg, unspecified: Secondary | ICD-10-CM | POA: Diagnosis not present

## 2017-11-10 DIAGNOSIS — M25559 Pain in unspecified hip: Secondary | ICD-10-CM | POA: Diagnosis not present

## 2017-11-10 DIAGNOSIS — R030 Elevated blood-pressure reading, without diagnosis of hypertension: Secondary | ICD-10-CM | POA: Diagnosis not present

## 2017-12-07 DIAGNOSIS — M5117 Intervertebral disc disorders with radiculopathy, lumbosacral region: Secondary | ICD-10-CM | POA: Diagnosis not present

## 2017-12-09 DIAGNOSIS — M5117 Intervertebral disc disorders with radiculopathy, lumbosacral region: Secondary | ICD-10-CM | POA: Diagnosis not present

## 2017-12-11 DIAGNOSIS — M5117 Intervertebral disc disorders with radiculopathy, lumbosacral region: Secondary | ICD-10-CM | POA: Diagnosis not present

## 2017-12-16 DIAGNOSIS — M5416 Radiculopathy, lumbar region: Secondary | ICD-10-CM | POA: Diagnosis not present

## 2017-12-16 DIAGNOSIS — G5712 Meralgia paresthetica, left lower limb: Secondary | ICD-10-CM | POA: Diagnosis not present

## 2017-12-22 DIAGNOSIS — G5712 Meralgia paresthetica, left lower limb: Secondary | ICD-10-CM | POA: Diagnosis not present

## 2018-01-05 DIAGNOSIS — M5416 Radiculopathy, lumbar region: Secondary | ICD-10-CM | POA: Diagnosis not present

## 2018-01-20 DIAGNOSIS — M545 Low back pain: Secondary | ICD-10-CM | POA: Diagnosis not present

## 2018-02-01 DIAGNOSIS — T84018S Broken internal joint prosthesis, other site, sequela: Secondary | ICD-10-CM | POA: Diagnosis not present

## 2018-02-01 DIAGNOSIS — G5712 Meralgia paresthetica, left lower limb: Secondary | ICD-10-CM | POA: Diagnosis not present

## 2018-02-01 DIAGNOSIS — M5416 Radiculopathy, lumbar region: Secondary | ICD-10-CM | POA: Diagnosis not present

## 2018-02-01 DIAGNOSIS — Z96649 Presence of unspecified artificial hip joint: Secondary | ICD-10-CM | POA: Diagnosis not present

## 2018-04-09 IMAGING — DX DG CHEST 2V
2 series · 2 of 2 positions shown · non-contrast
Comparison: None.

CLINICAL DATA: Chest pain

EXAM:
CHEST  2 VIEW

[chest pa]
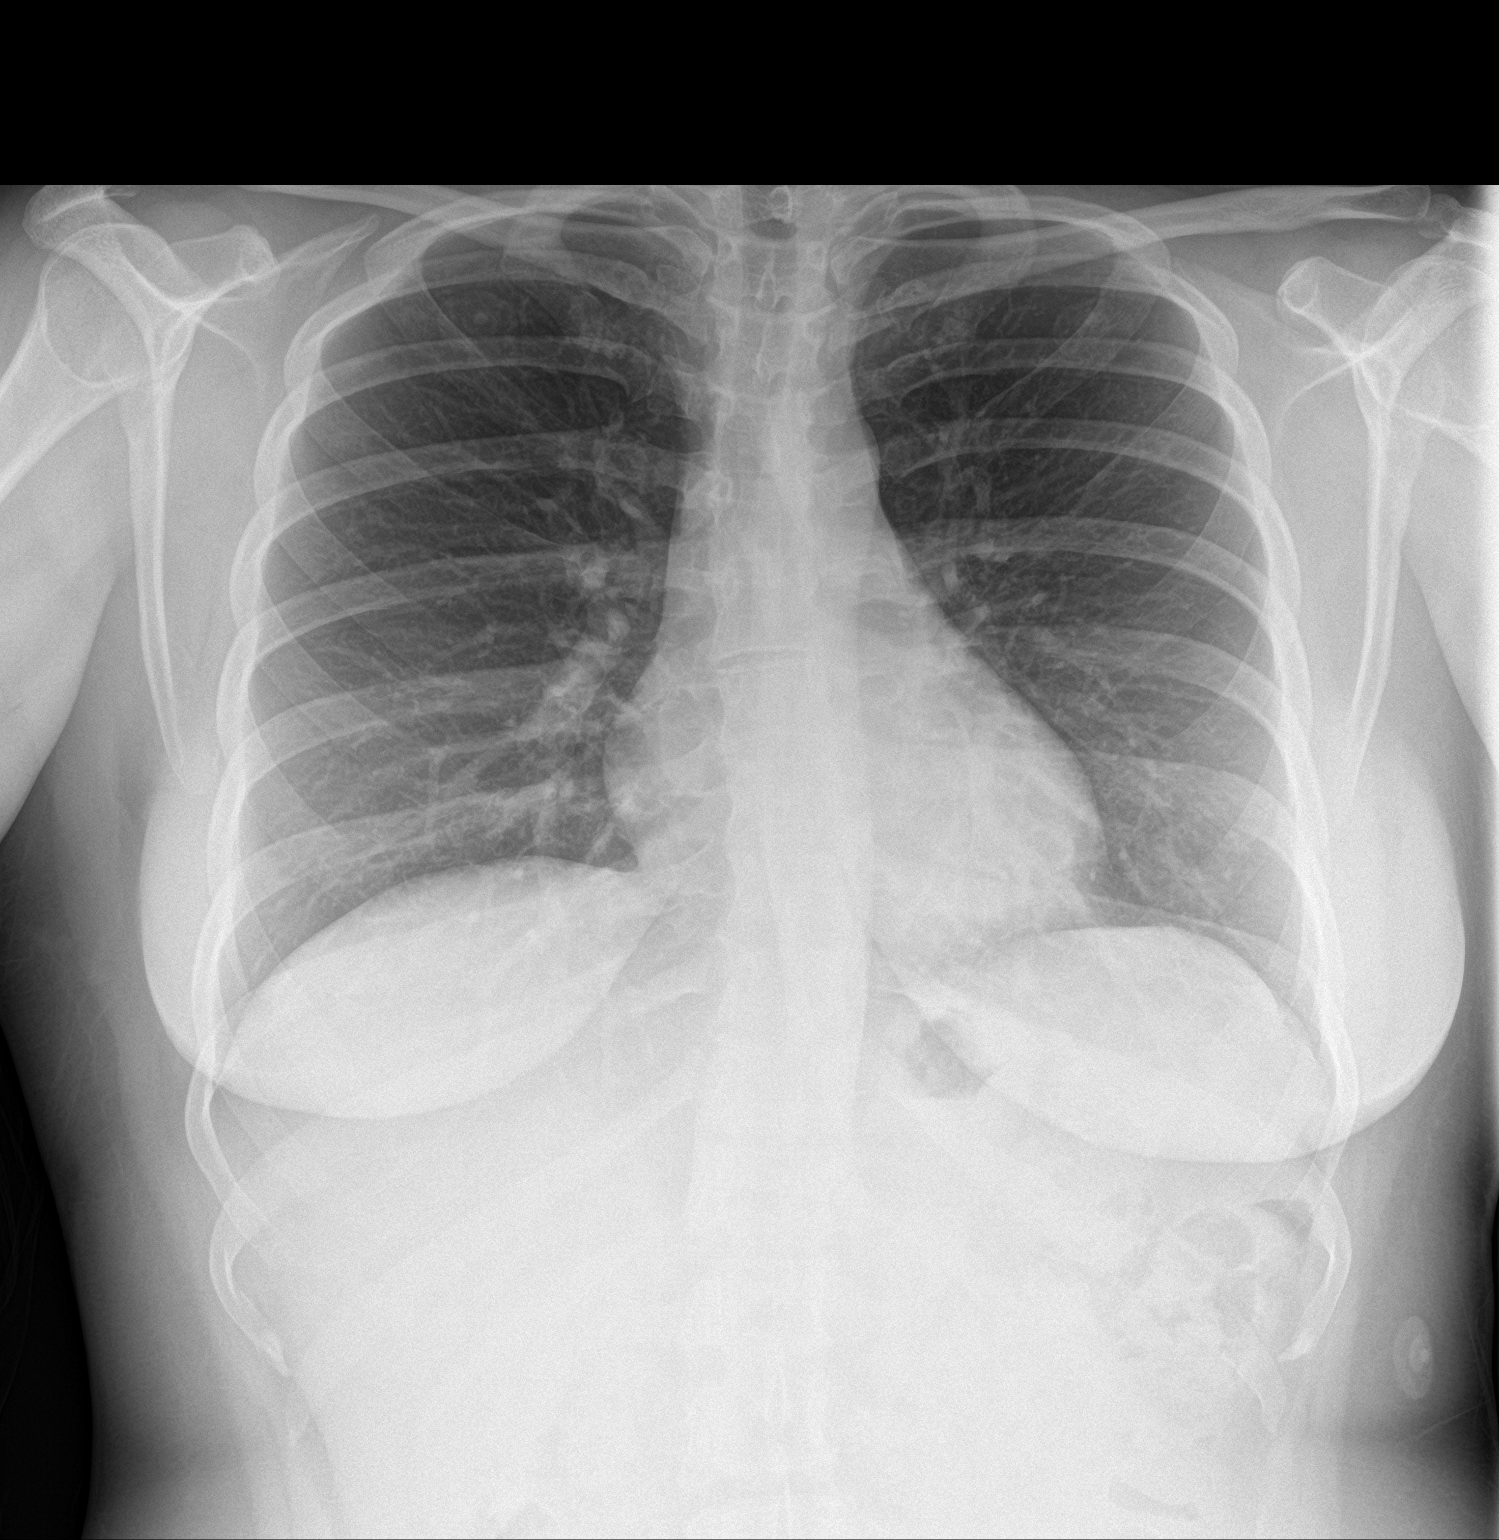

[chest lat]
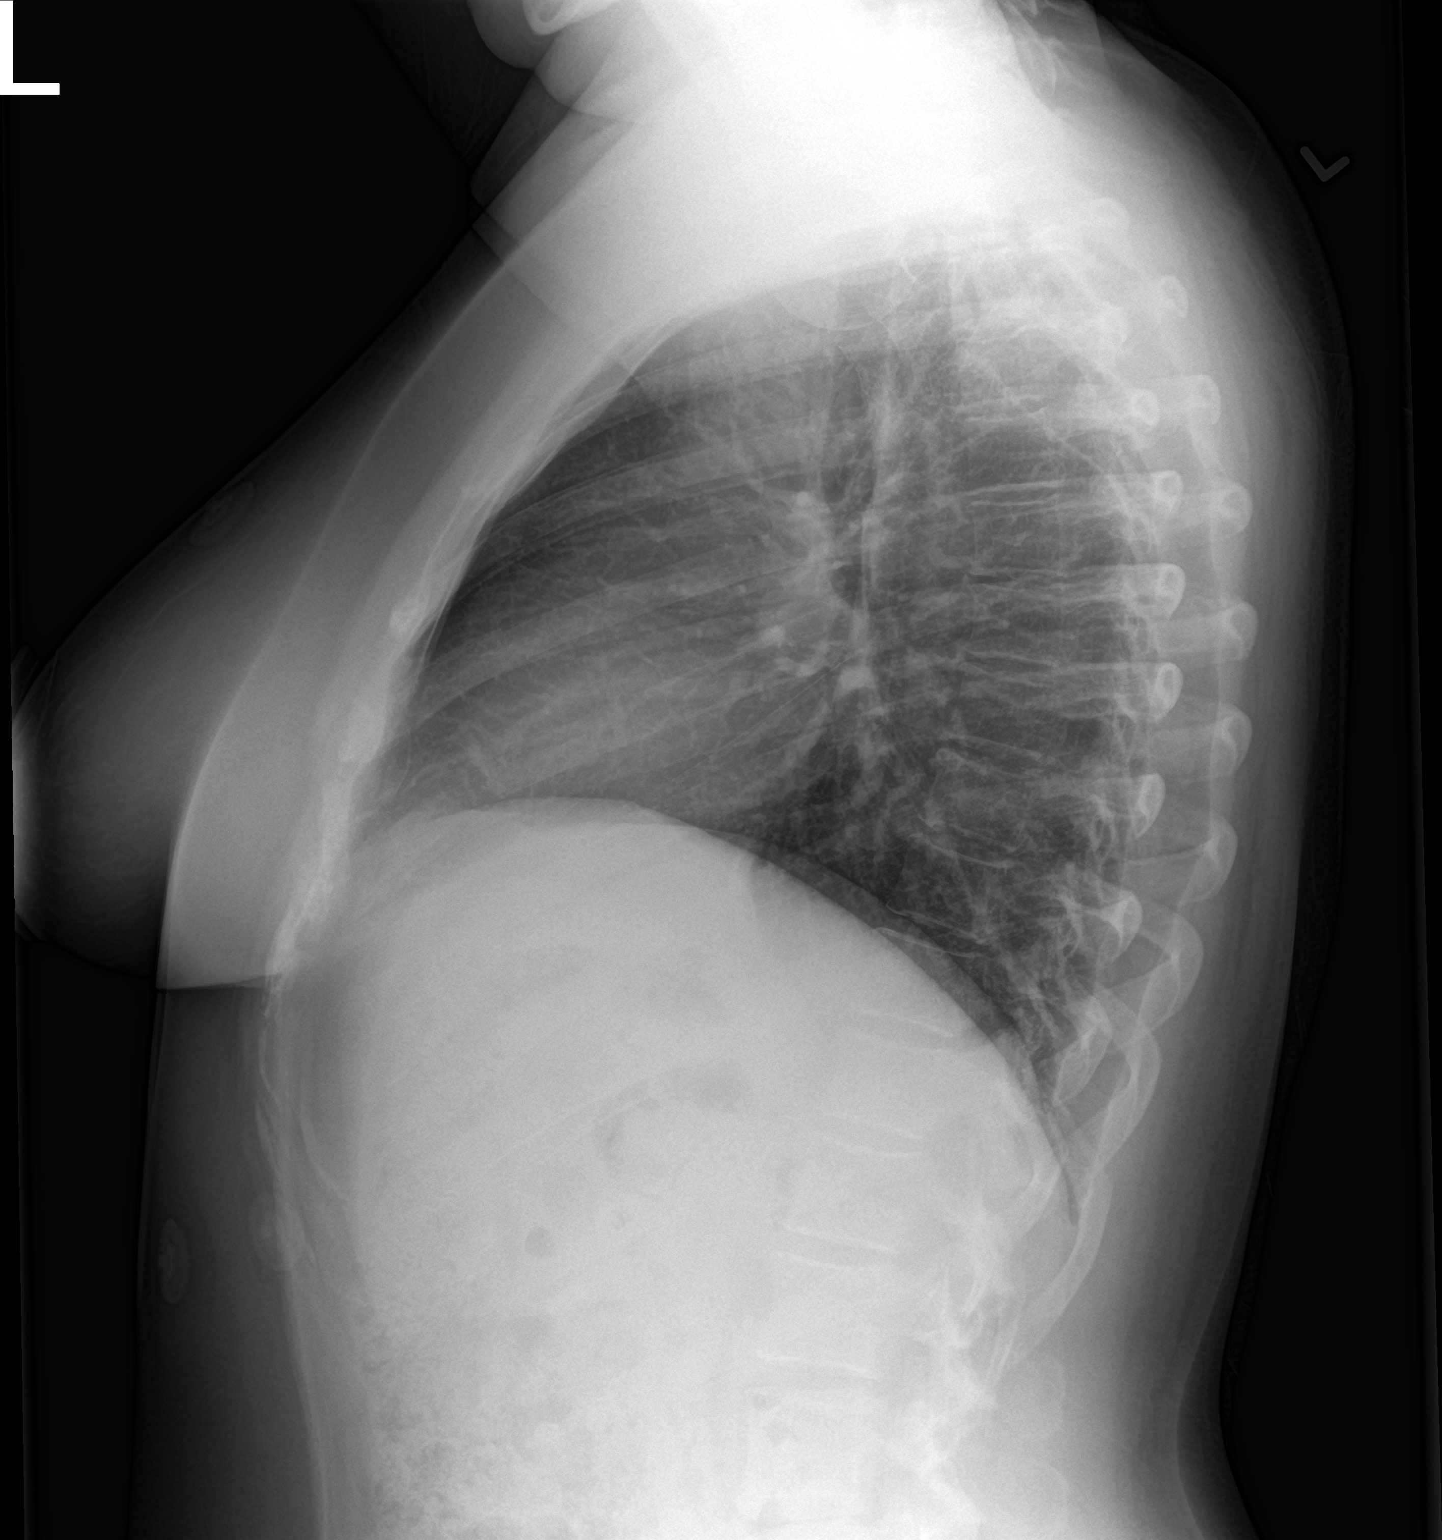

[2 of 2 positions shown; findings below may reference images not displayed]

FINDINGS: The heart size and mediastinal contours are within normal limits.
Both lungs are clear. The visualized skeletal structures are
unremarkable.
IMPRESSION: No active cardiopulmonary disease.

## 2018-06-18 DIAGNOSIS — J329 Chronic sinusitis, unspecified: Secondary | ICD-10-CM | POA: Diagnosis not present

## 2018-06-18 DIAGNOSIS — R030 Elevated blood-pressure reading, without diagnosis of hypertension: Secondary | ICD-10-CM | POA: Diagnosis not present

## 2018-06-18 DIAGNOSIS — L309 Dermatitis, unspecified: Secondary | ICD-10-CM | POA: Diagnosis not present

## 2018-08-09 DIAGNOSIS — J019 Acute sinusitis, unspecified: Secondary | ICD-10-CM | POA: Diagnosis not present

## 2018-08-09 DIAGNOSIS — R51 Headache: Secondary | ICD-10-CM | POA: Diagnosis not present

## 2018-08-12 ENCOUNTER — Telehealth: Payer: Self-pay | Admitting: *Deleted

## 2018-08-12 NOTE — Telephone Encounter (Signed)
Called pt to update her chart prior to virtual visit with Dr. Lucia Gaskins next week. LVM with office number asking for call back.

## 2018-08-17 NOTE — Telephone Encounter (Signed)
Called pt again and LVM asking for call back to update chart before her virtual visit tomorrow 4/29 at 1:30 pm. Left office number for call back.

## 2018-08-18 ENCOUNTER — Ambulatory Visit (INDEPENDENT_AMBULATORY_CARE_PROVIDER_SITE_OTHER): Payer: BLUE CROSS/BLUE SHIELD | Admitting: Neurology

## 2018-08-18 ENCOUNTER — Encounter: Payer: Self-pay | Admitting: *Deleted

## 2018-08-18 ENCOUNTER — Other Ambulatory Visit: Payer: Self-pay

## 2018-08-18 DIAGNOSIS — G4484 Primary exertional headache: Secondary | ICD-10-CM

## 2018-08-18 DIAGNOSIS — G441 Vascular headache, not elsewhere classified: Secondary | ICD-10-CM

## 2018-08-18 DIAGNOSIS — R51 Headache with orthostatic component, not elsewhere classified: Secondary | ICD-10-CM

## 2018-08-18 DIAGNOSIS — H539 Unspecified visual disturbance: Secondary | ICD-10-CM

## 2018-08-18 DIAGNOSIS — R251 Tremor, unspecified: Secondary | ICD-10-CM

## 2018-08-18 DIAGNOSIS — R519 Headache, unspecified: Secondary | ICD-10-CM

## 2018-08-18 DIAGNOSIS — G43711 Chronic migraine without aura, intractable, with status migrainosus: Secondary | ICD-10-CM | POA: Diagnosis not present

## 2018-08-18 DIAGNOSIS — G43709 Chronic migraine without aura, not intractable, without status migrainosus: Secondary | ICD-10-CM

## 2018-08-18 MED ORDER — PROPRANOLOL HCL ER 60 MG PO CP24: 60.0000 mg | ORAL_CAPSULE | Freq: Every day | ORAL | 6 refills | Status: DC

## 2018-08-18 NOTE — Telephone Encounter (Signed)
Spoke with pt. Confirmed pt using 2 identifiers. Updated pt's chart including meds, PMH, etc. She will join meeting around 1:20 pm.

## 2018-08-18 NOTE — Telephone Encounter (Signed)
Per Irving Burton, pt called back to go over chart. Call [phone].

## 2018-08-18 NOTE — Addendum Note (Signed)
Addended by: Bertram Savin on: 08/18/2018 01:12 PM   Modules accepted: Orders
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
## 2018-08-23 ENCOUNTER — Encounter: Payer: Self-pay | Admitting: Neurology

## 2018-08-23 DIAGNOSIS — G43709 Chronic migraine without aura, not intractable, without status migrainosus: Secondary | ICD-10-CM | POA: Insufficient documentation

## 2018-08-25 ENCOUNTER — Telehealth: Payer: Self-pay | Admitting: Neurology

## 2018-08-25 NOTE — Telephone Encounter (Signed)
lvm for pt to call back about scheduling mri  [employer] Auth: [id number] (exp. 08/25/18 to 02/20/19)

## 2018-09-02 NOTE — Telephone Encounter (Signed)
I spoke to the patient and due to the cost she stated she wants to speak with her husband and then get back to me.. I did offer a payment plan.

## 2018-09-02 NOTE — Telephone Encounter (Signed)
Patient called back and stated she had not heard anything about scheduling, I informed her that Irving Burton had called and left her a VM asking her to call back and schedule on May 6th she has asked for a return call.

## 2018-09-28 NOTE — Telephone Encounter (Signed)
Patient called today to schedule her MRI. She is scheduled for 09/29/18 at [facility].

## 2018-09-29 ENCOUNTER — Other Ambulatory Visit: Payer: Self-pay

## 2018-09-29 ENCOUNTER — Ambulatory Visit: Payer: BC Managed Care – PPO

## 2018-10-12 ENCOUNTER — Other Ambulatory Visit: Payer: Self-pay

## 2018-10-12 ENCOUNTER — Ambulatory Visit: Payer: BC Managed Care – PPO

## 2018-10-12 DIAGNOSIS — R51 Headache: Secondary | ICD-10-CM

## 2018-10-12 DIAGNOSIS — G441 Vascular headache, not elsewhere classified: Secondary | ICD-10-CM

## 2018-10-12 DIAGNOSIS — R251 Tremor, unspecified: Secondary | ICD-10-CM | POA: Diagnosis not present

## 2018-10-12 DIAGNOSIS — H539 Unspecified visual disturbance: Secondary | ICD-10-CM

## 2018-10-12 DIAGNOSIS — G4484 Primary exertional headache: Secondary | ICD-10-CM

## 2018-10-12 MED ORDER — GADOBENATE DIMEGLUMINE 529 MG/ML IV SOLN
15.0000 mL | Freq: Once | INTRAVENOUS | Status: AC | PRN
  Administered 2018-10-12: 15 mL via INTRAVENOUS

## 2018-10-26 DIAGNOSIS — E669 Obesity, unspecified: Secondary | ICD-10-CM | POA: Diagnosis not present

## 2018-10-26 DIAGNOSIS — J310 Chronic rhinitis: Secondary | ICD-10-CM | POA: Diagnosis not present

## 2018-10-26 DIAGNOSIS — J343 Hypertrophy of nasal turbinates: Secondary | ICD-10-CM | POA: Diagnosis not present

## 2019-02-23 ENCOUNTER — Other Ambulatory Visit: Payer: Self-pay

## 2019-02-24 ENCOUNTER — Other Ambulatory Visit: Payer: Self-pay | Admitting: Neurology

## 2019-02-24 ENCOUNTER — Telehealth: Payer: Self-pay

## 2019-02-24 MED ORDER — PROPRANOLOL HCL ER 60 MG PO CP24: 60.0000 mg | ORAL_CAPSULE | Freq: Every day | ORAL | 4 refills | Status: AC

## 2019-02-24 NOTE — Telephone Encounter (Signed)
Received a refill request from [place] requesting a refill for Propranolol 60 mg. Per note from 08/18/2018 it looks like 10 mg of propranolol was discussed. MAR reflects 60 mg though. Will send message to Dr. Jaynee Eagles just to confirm.  I did call the pt, she confirms she is taking the 60 mg prescription and tolerating well.

## 2019-03-15 DIAGNOSIS — E669 Obesity, unspecified: Secondary | ICD-10-CM | POA: Diagnosis not present

## 2019-03-15 DIAGNOSIS — G4733 Obstructive sleep apnea (adult) (pediatric): Secondary | ICD-10-CM | POA: Diagnosis not present

## 2019-03-15 DIAGNOSIS — J343 Hypertrophy of nasal turbinates: Secondary | ICD-10-CM | POA: Diagnosis not present

## 2019-03-15 DIAGNOSIS — J342 Deviated nasal septum: Secondary | ICD-10-CM | POA: Diagnosis not present

## 2019-04-18 ENCOUNTER — Other Ambulatory Visit (HOSPITAL_BASED_OUTPATIENT_CLINIC_OR_DEPARTMENT_OTHER): Payer: Self-pay

## 2019-04-18 DIAGNOSIS — G4733 Obstructive sleep apnea (adult) (pediatric): Secondary | ICD-10-CM

## 2019-04-18 DIAGNOSIS — R0683 Snoring: Secondary | ICD-10-CM

## 2019-06-23 ENCOUNTER — Encounter (HOSPITAL_BASED_OUTPATIENT_CLINIC_OR_DEPARTMENT_OTHER): Payer: BLUE CROSS/BLUE SHIELD | Admitting: Internal Medicine

## 2020-08-13 DIAGNOSIS — Z0271 Encounter for disability determination: Secondary | ICD-10-CM

## 2022-07-09 ENCOUNTER — Other Ambulatory Visit (INDEPENDENT_AMBULATORY_CARE_PROVIDER_SITE_OTHER): Payer: Self-pay | Admitting: Pediatric Genetics

## 2022-07-09 DIAGNOSIS — Z1589 Genetic susceptibility to other disease: Secondary | ICD-10-CM | POA: Insufficient documentation

## 2022-07-09 NOTE — Progress Notes (Signed)
I evaluated [name] child, Cathy Brightly "Hiley Hillstrom, in my Pediatric Genetics clinic. Secondary findings on whole exome sequencing identified a pathogenic variant in [name] that was maternally inherited (from [name]).    Pathogenic variants in [name] are associated with increased risk of hereditary paraganglioma-pheochromocytoma (PGL/PCC) syndrome. Referral placed for [name] to be followed by [facility] for routine surveillance.   [name], [place]

## 2022-08-22 ENCOUNTER — Telehealth: Payer: Self-pay

## 2022-08-22 ENCOUNTER — Encounter: Payer: Self-pay | Admitting: Internal Medicine

## 2022-08-22 ENCOUNTER — Ambulatory Visit (INDEPENDENT_AMBULATORY_CARE_PROVIDER_SITE_OTHER): Payer: Medicaid Other | Admitting: Internal Medicine

## 2022-08-22 VITALS — BP 138/84 | HR 86 | Ht 66.0 in | Wt 244.4 lb

## 2022-08-22 DIAGNOSIS — R7989 Other specified abnormal findings of blood chemistry: Secondary | ICD-10-CM | POA: Diagnosis not present

## 2022-08-22 DIAGNOSIS — Z1589 Genetic susceptibility to other disease: Secondary | ICD-10-CM

## 2022-08-22 DIAGNOSIS — Z8639 Personal history of other endocrine, nutritional and metabolic disease: Secondary | ICD-10-CM
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
## 2022-08-22 NOTE — Progress Notes (Deleted)
Patient ID: Cathy Morrow, female   DOB: [birth year], 42 y.o.   MRN: [id number]

## 2022-08-22 NOTE — Telephone Encounter (Signed)
Called and left a message for pt to call back to schedule new pt appt. °

## 2022-08-22 NOTE — Patient Instructions (Addendum)
Please stop at the lab.  You should have an endocrinology follow-up appointment in 1 year.  

## 2022-08-26 LAB — TSH: TSH: 2.73 mIU/L

## 2022-08-26 LAB — METANEPHRINES, PLASMA
Metanephrine, Free: <25 pg/mL (ref <=57)
Normetanephrine, Free: 120 pg/mL (ref <=148)
Total Metanephrines-Plasma: 120 pg/mL (ref <=205)

## 2022-08-27 ENCOUNTER — Encounter: Payer: Self-pay | Admitting: Internal Medicine

## 2022-09-18 ENCOUNTER — Encounter (HOSPITAL_COMMUNITY)
Admission: RE | Admit: 2022-09-18 | Discharge: 2022-09-18 | Disposition: A | Discharge status: Home / Self Care | Payer: Medicaid Other | Source: Ambulatory Visit | Attending: Internal Medicine | Admitting: Internal Medicine

## 2022-09-18 DIAGNOSIS — N200 Calculus of kidney: Secondary | ICD-10-CM | POA: Diagnosis present

## 2022-09-18 DIAGNOSIS — Z1589 Genetic susceptibility to other disease: Secondary | ICD-10-CM | POA: Diagnosis not present

## 2022-09-18 MED ORDER — COPPER CU 64 DOTATATE 1 MCI/ML IV SOLN
4.0000 | Freq: Once | INTRAVENOUS | Status: AC
  Administered 2022-09-18: 4.2 via INTRAVENOUS

## 2022-09-23 ENCOUNTER — Encounter: Payer: Self-pay | Admitting: Internal Medicine

## 2022-09-25 ENCOUNTER — Ambulatory Visit (HOSPITAL_COMMUNITY)
Admission: RE | Admit: 2022-09-25 | Discharge: 2022-09-25 | Disposition: A | Discharge status: Home / Self Care | Payer: Medicaid Other | Source: Ambulatory Visit | Attending: Internal Medicine | Admitting: Internal Medicine

## 2022-09-25 DIAGNOSIS — Z1589 Genetic susceptibility to other disease: Secondary | ICD-10-CM | POA: Diagnosis present

## 2022-09-25 MED ORDER — GADOBUTROL 1 MMOL/ML IV SOLN
10.0000 mL | Freq: Once | INTRAVENOUS | Status: AC | PRN
  Administered 2022-09-25: 10 mL via INTRAVENOUS

## 2022-12-16 ENCOUNTER — Ambulatory Visit (INDEPENDENT_AMBULATORY_CARE_PROVIDER_SITE_OTHER): Payer: Medicaid Other | Admitting: Neurology

## 2022-12-16 ENCOUNTER — Encounter: Payer: Self-pay | Admitting: Neurology

## 2022-12-16 VITALS — BP 139/95 | HR 70 | Ht 66.0 in | Wt 248.0 lb

## 2022-12-16 DIAGNOSIS — R0683 Snoring: Secondary | ICD-10-CM | POA: Diagnosis not present

## 2022-12-16 DIAGNOSIS — Z9189 Other specified personal risk factors, not elsewhere classified: Secondary | ICD-10-CM

## 2022-12-16 DIAGNOSIS — R351 Nocturia: Secondary | ICD-10-CM

## 2022-12-16 DIAGNOSIS — G4719 Other hypersomnia: Secondary | ICD-10-CM

## 2022-12-16 DIAGNOSIS — Z820 Family history of epilepsy and other diseases of the nervous system: Secondary | ICD-10-CM
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
## 2022-12-18 ENCOUNTER — Telehealth: Payer: Self-pay | Admitting: Neurology

## 2022-12-18 NOTE — Telephone Encounter (Signed)
[facility]: [id number] (exp. 12/18/22 to 04/21/23)   Raye Sorrow message.

## 2023-02-10 ENCOUNTER — Ambulatory Visit (INDEPENDENT_AMBULATORY_CARE_PROVIDER_SITE_OTHER): Payer: Medicaid Other | Admitting: Podiatry

## 2023-02-10 DIAGNOSIS — Z91199 Patient's noncompliance with other medical treatment and regimen due to unspecified reason: Secondary | ICD-10-CM

## 2023-02-11 NOTE — Progress Notes (Signed)
Patient was no-show for appointment today 

## 2023-03-03 ENCOUNTER — Ambulatory Visit: Payer: Medicaid Other | Admitting: Neurology

## 2023-03-03 DIAGNOSIS — G4719 Other hypersomnia: Secondary | ICD-10-CM

## 2023-03-03 DIAGNOSIS — G4720 Circadian rhythm sleep disorder, unspecified type: Secondary | ICD-10-CM

## 2023-03-03 DIAGNOSIS — R0683 Snoring: Secondary | ICD-10-CM

## 2023-03-03 DIAGNOSIS — G4733 Obstructive sleep apnea (adult) (pediatric): Secondary | ICD-10-CM

## 2023-03-03 DIAGNOSIS — R351 Nocturia: Secondary | ICD-10-CM

## 2023-03-03 DIAGNOSIS — Z820 Family history of epilepsy and other diseases of the nervous system: Secondary | ICD-10-CM

## 2023-03-03 DIAGNOSIS — Z9189 Other specified personal risk factors, not elsewhere classified: Secondary | ICD-10-CM

## 2023-03-05 NOTE — Addendum Note (Signed)
Addended by: Huston Foley on: 03/05/2023 05:49 PM   Modules accepted: Orders

## 2023-03-05 NOTE — Procedures (Signed)
Physician Interpretation:     [facility] POLYSOMNOGRAPHY  INTERPRETATION REPORT   STUDY DATE:  03/03/2023     PATIENT NAME:  Cathy Morrow         DATE OF BIRTH:  1980-08-29  PATIENT ID:  [id number]    TYPE OF STUDY:  PSG  READING PHYSICIAN: Huston Foley, MD, PhD   SCORING TECHNICIAN: Domingo Cocking, RPSGT   Referred by: Sherron Flemings, NP ? History and Indication for Testing: 42 year old female with an underlying history of allergic rhinitis, vitamin D deficiency, chronic headaches (seen by my colleague Dr. Lucia Gaskins in 2020), history of seizures per chart review, tremors, and severe obesity with a BMI of over 40, who reports snoring and excessive daytime somnolence. Her Epworth sleepiness score is 13 out of 24, fatigue severity score is 43 out of 63. Height: 66 in Weight: 248 lb (BMI 40) Neck Size: 15 in    MEDICATIONS: Cymbalta, Elderberry, Claritin, Inderal LA, Kenalog   TECHNICAL DESCRIPTION: A registered sleep technologist was in attendance for the duration of the recording.  Data collection, scoring, video monitoring, and reporting were performed in compliance with the AASM Manual for the Scoring of Sleep and Associated Events; (Hypopnea is scored based on the criteria listed in Section VIII D. 1b in the AASM Manual V2.6 using a 4% oxygen desaturation rule or Hypopnea is scored based on the criteria listed in Section VIII D. 1a in the AASM Manual V2.6 using 3% oxygen desaturation and /or arousal rule).   SLEEP CONTINUITY AND SLEEP ARCHITECTURE:  Lights-out was at 21:53: and lights-on at  05:03:, with a total recording time of 7 hours, 9.5 min. Total sleep time ( TST) was 271.0 minutes with a decreased sleep efficiency at 63.1%. There was  5.4% REM sleep.   BODY POSITION:  TST was divided  between the following sleep positions: 9.6% supine;  90.4% lateral;  0% prone. Duration of total sleep and percent of total sleep in their respective position is as  follows: supine 26 minutes (10%), non-supine 245 minutes (90%); right 92 minutes (34%), left 153 minutes (56%), and prone 00 minutes (0%).  Total supine REM sleep time was 00 minutes (0% of total REM sleep).  Sleep latency was increased at 45.0 minutes.  REM sleep latency was mildly increased at 132.0 minutes. Of the total sleep time, the percentage of stage N1 sleep was 14.2%, which is increased, stage N2 sleep was 70%, which is increased, stage N3 sleep was 10.1%, which is reduced, and REM sleep was 5.4%, which is markedly reduced. Wake after sleep onset (WASO) time accounted for 113.5 with sleep fragmentation noted, ranging from minimal to moderate.   RESPIRATORY MONITORING:   Based on CMS criteria (using a 4% oxygen desaturation rule for scoring hypopneas), there were 6 apneas (3 obstructive; 0 central; 3 mixed), and 26 hypopneas.  Apnea index was 1.3. Hypopnea index was 5.8. The apnea-hypopnea index was 7.1 overall (0.0 supine, 41 non-supine; 41.4 REM, 0.0 supine REM).  There were 0 respiratory effort-related arousals (RERAs).  The RERA index was 0 events/h. Total respiratory disturbance index (RDI) was 7.1 events/h. RDI results showed: supine RDI  0.0 /h; non-supine RDI 7.8 /h; REM RDI 41.4 /h, supine REM RDI 0.0 /h.   Based on AASM criteria (using a 3% oxygen desaturation and /or arousal rule for scoring hypopneas), there were 6 apneas (3 obstructive; 0 central; 3 mixed), and 39 hypopneas. Apnea index was 1.3. Hypopnea index was 8.6. The apnea-hypopnea  index was 10.0/hour overall (0.0 supine, 46 non-supine; 45.5 REM, 0.0 supine REM).  There were 0 respiratory effort-related arousals (RERAs).  The RERA index was 0 events/h. Total respiratory disturbance index (RDI) was 10.0 events/h. RDI results showed: supine RDI  0.0 /h; non-supine RDI 11.0 /h; REM RDI 45.5 /h, supine REM RDI 0.0 /h.    OXIMETRY: Oxyhemoglobin Saturation Nadir during sleep was at  79% from a mean of 94%.  Of the Total sleep time  (TST)   hypoxemia (=<88%) was present for  2.5 minutes, or 0.9% of total sleep time.   LIMB MOVEMENTS: There were 28 periodic limb movements of sleep (6.2/hr), of which 3 (0.7/hr) were associated with an arousal.   AROUSAL: There were 57 arousals in total, for an arousal index of 13 arousals/hour.  Of these, 6 were identified as respiratory-related arousals (1 /h), 3 were PLM-related arousals (1 /h), and 56 were non-specific arousals (12 /h).   EEG: Review of the EEG showed no abnormal electrical discharges and symmetrical bihemispheric findings.    EKG: The EKG revealed normal sinus rhythm (NSR). The average heart rate during sleep was 83 bpm.   AUDIO/VIDEO REVIEW: The audio and video review did not show any abnormal or unusual behaviors, movements, phonations or vocalizations. The patient took 1 restroom break. Snoring was noted, in the mild to moderate range.  POST-STUDY QUESTIONNAIRE: Post study, the patient indicated, that sleep was the same as usual.   IMPRESSION:   1. Mild Obstructive Sleep Apnea (OSA) 2. Dysfunctions associated with sleep stages or arousal from sleep  RECOMMENDATIONS:   1. This study demonstrates overall mild obstructive sleep apnea, severe during REM sleep with a total AHI of 10.0/hour, REM AHI of 45.5/hour, supine AHI of 0/hour and O2 nadir of 83% (during nonsupine REM sleep). Given the patient's medical history and sleep related complaints, treatment with positive airway pressure is recommended; this can be achieved in the form of autoPAP therapy at home. A full-night CPAP titration study would allow optimization of therapy if needed, down the road. Other treatment options may include avoidance of supine sleep position along with weight loss, or the use of an oral appliance in selected patients. Please note, that untreated obstructive sleep apnea may carry additional perioperative morbidity. Patients with significant obstructive sleep apnea should receive  perioperative PAP therapy and the surgeons and particularly the anesthesiologist should be informed of the diagnosis and the severity of the sleep disordered breathing. 2. This study shows sleep fragmentation and abnormal sleep stage percentages; these are nonspecific findings and per se do not signify an intrinsic sleep disorder or a cause for the patient's sleep-related symptoms. Causes include (but are not limited to) the first night effect of the sleep study, circadian rhythm disturbances, medication effect or an underlying mood disorder or medical problem.  3. The patient should be cautioned not to drive, work at heights, or operate dangerous or heavy equipment when tired or sleepy. Review and reiteration of good sleep hygiene measures should be pursued with any patient. 4. The patient will be seen in follow-up by Dr. Frances Furbish at [facility] for discussion of the test results and further management strategies. The referring provider will be notified of the test results.   I certify that I have reviewed the entire raw data recording prior to the issuance of this report in accordance with the Standards of Accreditation of the American Academy of Sleep Medicine (AASM).  Huston Foley, MD, PhD Medical Director, [facility] (  [facility]) Diplomat, ABPN (Neurology and Sleep)               Technical Report:   General Information  Name: Cathy Morrow, Cathy Morrow BMI: 40.03 Physician: Huston Foley, MD  ID: [id number] Height: 66.0 in Technician: Domingo Cocking, RPSGT  Sex: Female Weight: 248.0 lb Record: [id number]  Age: 51 [May 10, 1980] Date: 03/03/2023    Medical & Medication History    Ms. Hiott is a 42 year old female with an underlying history of allergic rhinitis, vitamin D deficiency, chronic headaches (seen by my colleague Dr. Lucia Gaskins in 2020), history of seizures per chart review, tremors, and severe obesity with a BMI of over 40, who reports snoring and excessive daytime  somnolence. Her Epworth sleepiness score is 13 out of 24, fatigue severity score is 43 out of 63. She does not sleep well, she sleeps about 3 to 5 hours on an average night, tries to be in bed around 10 PM and rise time is around 7:30 AM.  Cymbalta, Elderberry, Claritin, Inderal LA, Kenalog   Sleep Disorder      Comments   Patient arrived for a diagnostic polysomnogram. Procedure explained and all questions answered. Standard paste setup without complications. Patient slept supine, right, and left. Mild to moderate snoring and snorting heard. Respiratory events observed. No obvious cardiac arrhythmias observed. No significant PLMS observed. One restroom visit.    Lights out: 09:53:43 PM Lights on: 05:03:19 AM   Time Total Supine Side Prone Upright  Recording (TRT) 7h 9.72m 1h 32.80m 5h 37.88m 0h 0.71m 0h 0.64m  Sleep (TST) 4h 31.85m 0h 26.46m 4h 5.24m 0h 0.35m 0h 0.3m   Latency N1 N2 N3 REM Onset Per. Slp. Eff.  Actual 0h 0.39m 0h 4.33m 0h 43.98m 2h 12.61m 0h 45.22m 0h 47.52m 63.10%   Stg Dur Wake N1 N2 N3 REM  Total 158.5 38.5 190.5 27.5 14.5  Supine 66.0 5.0 19.0 2.0 0.0  Side 92.5 33.5 171.5 25.5 14.5  Prone 0.0 0.0 0.0 0.0 0.0  Upright 0.0 0.0 0.0 0.0 0.0   Stg % Wake N1 N2 N3 REM  Total 36.9 14.2 70.3 10.1 5.4  Supine 15.4 1.8 7.0 0.7 0.0  Side 21.5 12.4 63.3 9.4 5.4  Prone 0.0 0.0 0.0 0.0 0.0  Upright 0.0 0.0 0.0 0.0 0.0     Apnea Summary Sub Supine Side Prone Upright  Total 6 Total 6 0 6 0 0    REM 2 0 2 0 0    NREM 4 0 4 0 0  Obs 3 REM 2 0 2 0 0    NREM 1 0 1 0 0  Mix 3 REM 0 0 0 0 0    NREM 3 0 3 0 0  Cen 0 REM 0 0 0 0 0    NREM 0 0 0 0 0   Rera Summary Sub Supine Side Prone Upright  Total 0 Total 0 0 0 0 0    REM 0 0 0 0 0    NREM 0 0 0 0 0   Hypopnea Summary Sub Supine Side Prone Upright  Total 39 Total 39 0 39 0 0    REM 9 0 9 0 0    NREM 30 0 30 0 0   4% Hypopnea Summary Sub Supine Side Prone Upright  Total (4%) 26 Total 26 0 26 0 0    REM 8 0 8 0 0    NREM 18 0 18  0 0     AHI Total  Obs Mix Cen  9.96 Apnea 1.33 0.66 0.66 0.00   Hypopnea 8.63 -- -- --  7.08 Hypopnea (4%) 5.76 -- -- --    Total Supine Side Prone Upright  Position AHI 9.96 0.00 11.02 0.00 0.00  REM AHI 45.52   NREM AHI 7.95   Position RDI 9.96 0.00 11.02 0.00 0.00  REM RDI 45.52   NREM RDI 7.95    4% Hypopnea Total Supine Side Prone Upright  Position AHI (4%) 7.08 0.00 7.84 0.00 0.00  REM AHI (4%) 41.38   NREM AHI (4%) 5.15   Position RDI (4%) 7.08 0.00 7.84 0.00 0.00  REM RDI (4%) 41.38   NREM RDI (4%) 5.15    Desaturation Information Threshold: 2% <100% <90% <80% <70% <60% <50% <40%  Supine 67.0 3.0 0.0 0.0 0.0 0.0 0.0  Side 258.0 18.0 0.0 0.0 0.0 0.0 0.0  Prone 0.0 0.0 0.0 0.0 0.0 0.0 0.0  Upright 0.0 0.0 0.0 0.0 0.0 0.0 0.0  Total 325.0 21.0 0.0 0.0 0.0 0.0 0.0  Index 50.8 3.3 0.0 0.0 0.0 0.0 0.0   Threshold: 3% <100% <90% <80% <70% <60% <50% <40%  Supine 38.0 3.0 0.0 0.0 0.0 0.0 0.0  Side 147.0 18.0 0.0 0.0 0.0 0.0 0.0  Prone 0.0 0.0 0.0 0.0 0.0 0.0 0.0  Upright 0.0 0.0 0.0 0.0 0.0 0.0 0.0  Total 185.0 21.0 0.0 0.0 0.0 0.0 0.0  Index 28.9 3.3 0.0 0.0 0.0 0.0 0.0   Threshold: 4% <100% <90% <80% <70% <60% <50% <40%  Supine 24.0 3.0 0.0 0.0 0.0 0.0 0.0  Side 83.0 18.0 0.0 0.0 0.0 0.0 0.0  Prone 0.0 0.0 0.0 0.0 0.0 0.0 0.0  Upright 0.0 0.0 0.0 0.0 0.0 0.0 0.0  Total 107.0 21.0 0.0 0.0 0.0 0.0 0.0  Index 16.7 3.3 0.0 0.0 0.0 0.0 0.0   Threshold: 3% <100% <90% <80% <70% <60% <50% <40%  Supine 38 3 0 0 0 0 0  Side 147 18 0 0 0 0 0  Prone 0 0 0 0 0 0 0  Upright 0 0 0 0 0 0 0  Total 185 21 0 0 0 0 0   Awakening/Arousal Information # of Awakenings 21  Wake after sleep onset 113.72m  Wake after persistent sleep 111.47m   Arousal Assoc. Arousals Index  Apneas 2 0.4  Hypopneas 5 1.1  Leg Movements 6 1.3  Snore 0 0.0  PTT Arousals 0 0.0  Spontaneous 56 12.4  Total 69 15.3  Leg Movement Information PLMS LMs Index  Total LMs during PLMS 28 6.2  LMs w/  Microarousals 3 0.7   LM LMs Index  w/ Microarousal 3 0.7  w/ Awakening 2 0.4  w/ Resp Event 0 0.0  Spontaneous 32 7.1  Total 35 7.7     Desaturation threshold setting: 3% Minimum desaturation setting: 10 seconds SaO2 nadir: 79% The longest event was a 26 sec mixed Apnea with a minimum SaO2 of 94%. The lowest SaO2 was 83% associated with a 11 sec obstructive Apnea. EKG Rates EKG Avg Max Min  Awake 95 129 73  Asleep 83 119 69  EKG Events: Tachycardia

## 2023-08-24 ENCOUNTER — Ambulatory Visit (INDEPENDENT_AMBULATORY_CARE_PROVIDER_SITE_OTHER): Payer: Medicaid Other | Admitting: Internal Medicine

## 2023-08-24 ENCOUNTER — Encounter: Payer: Self-pay | Admitting: Internal Medicine

## 2023-08-24 VITALS — BP 120/80 | HR 96 | Ht 66.0 in | Wt 251.6 lb

## 2023-08-24 DIAGNOSIS — R7989 Other specified abnormal findings of blood chemistry: Secondary | ICD-10-CM

## 2023-08-24 DIAGNOSIS — Z1589 Genetic susceptibility to other disease: Secondary | ICD-10-CM

## 2023-08-24 NOTE — Patient Instructions (Signed)
Please stop at the lab.  You should have an endocrinology follow-up appointment in 1 year.
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
## 2023-08-29 LAB — METANEPHRINES, PLASMA
Metanephrine, Free: <25 pg/mL (ref <=57)
Normetanephrine, Free: 88 pg/mL (ref <=148)
Total Metanephrines-Plasma: 88 pg/mL (ref <=205)

## 2023-08-31 ENCOUNTER — Encounter: Payer: Self-pay | Admitting: Internal Medicine

## 2024-01-06 ENCOUNTER — Other Ambulatory Visit: Payer: Self-pay | Admitting: Family Medicine

## 2024-01-06 DIAGNOSIS — Z1231 Encounter for screening mammogram for malignant neoplasm of breast: Secondary | ICD-10-CM

## 2024-01-15 ENCOUNTER — Ambulatory Visit
Admission: RE | Admit: 2024-01-15 | Discharge: 2024-01-15 | Disposition: A | Discharge status: Home / Self Care | Source: Ambulatory Visit | Attending: Family Medicine | Admitting: Family Medicine

## 2024-01-15 DIAGNOSIS — Z1231 Encounter for screening mammogram for malignant neoplasm of breast: Secondary | ICD-10-CM

## 2024-08-22 ENCOUNTER — Ambulatory Visit: Admitting: Internal Medicine

## 2024-08-23 ENCOUNTER — Ambulatory Visit: Admitting: Internal Medicine
